# Patient Record
Sex: Female | Born: 1950 | Race: White | Hispanic: No | Marital: Married | Smoking: Former smoker
Health system: Southern US, Community
[De-identification: ages and names within clinical notes are randomized; demographics above are authoritative.]

## PROBLEM LIST (undated history)

## (undated) DIAGNOSIS — M1990 Unspecified osteoarthritis, unspecified site: Secondary | ICD-10-CM

## (undated) DIAGNOSIS — J45909 Unspecified asthma, uncomplicated: Secondary | ICD-10-CM

## (undated) DIAGNOSIS — F329 Major depressive disorder, single episode, unspecified: Secondary | ICD-10-CM

## (undated) DIAGNOSIS — E039 Hypothyroidism, unspecified: Secondary | ICD-10-CM

## (undated) DIAGNOSIS — J189 Pneumonia, unspecified organism: Secondary | ICD-10-CM

## (undated) DIAGNOSIS — T8859XA Other complications of anesthesia, initial encounter: Secondary | ICD-10-CM

## (undated) DIAGNOSIS — F32A Depression, unspecified: Secondary | ICD-10-CM

## (undated) HISTORY — PX: CERVICAL FUSION: SHX112

## (undated) HISTORY — PX: DILATION AND CURETTAGE OF UTERUS: SHX78

## (undated) HISTORY — PX: APPENDECTOMY: SHX54

## (undated) HISTORY — PX: DIAGNOSTIC LAPAROSCOPY: SUR761

## (undated) HISTORY — PX: BREAST SURGERY: SHX581

## (undated) HISTORY — PX: LUMBAR LAMINECTOMY/DECOMPRESSION MICRODISCECTOMY: SHX5026

## (undated) HISTORY — PX: TONSILLECTOMY: SUR1361

## (undated) HISTORY — PX: ABDOMINAL HYSTERECTOMY: SHX81

## (undated) HISTORY — PX: CHOLECYSTECTOMY: SHX55

---

## 2003-11-09 ENCOUNTER — Ambulatory Visit (HOSPITAL_COMMUNITY): Admission: RE | Admit: 2003-11-09 | Discharge: 2003-11-10 | Payer: Self-pay | Admitting: Neurosurgery

## 2008-11-09 ENCOUNTER — Encounter: Admission: RE | Admit: 2008-11-09 | Discharge: 2008-11-09 | Payer: Self-pay | Admitting: Neurosurgery

## 2010-06-27 ENCOUNTER — Encounter
Admission: RE | Admit: 2010-06-27 | Discharge: 2010-06-27 | Payer: Self-pay | Source: Home / Self Care | Attending: Neurosurgery | Admitting: Neurosurgery

## 2010-08-03 ENCOUNTER — Encounter (HOSPITAL_COMMUNITY)
Admission: RE | Admit: 2010-08-03 | Discharge: 2010-08-03 | Disposition: A | Discharge status: Home / Self Care | Payer: BC Managed Care – PPO | Source: Ambulatory Visit | Attending: Neurosurgery | Admitting: Neurosurgery

## 2010-08-03 LAB — CBC
HCT: 45.9 % (ref 36.0–46.0)
Hemoglobin: 15.2 g/dL — ABNORMAL HIGH (ref 12.0–15.0)
MCHC: 33.1 g/dL (ref 30.0–36.0)
MCV: 95 fL (ref 78.0–100.0)
RBC: 4.83 MIL/uL (ref 3.87–5.11)
RDW: 13.4 % (ref 11.5–15.5)
WBC: 7.6 10*3/uL (ref 4.0–10.5)

## 2010-08-08 ENCOUNTER — Other Ambulatory Visit (HOSPITAL_COMMUNITY): Payer: Self-pay | Admitting: Neurosurgery

## 2010-08-08 ENCOUNTER — Observation Stay (HOSPITAL_COMMUNITY)
Admission: RE | Admit: 2010-08-08 | Discharge: 2010-08-09 | Disposition: A | Discharge status: Home / Self Care | Payer: BC Managed Care – PPO | Source: Ambulatory Visit | Attending: Neurosurgery | Admitting: Neurosurgery

## 2010-08-08 ENCOUNTER — Inpatient Hospital Stay (HOSPITAL_COMMUNITY): Payer: BC Managed Care – PPO

## 2010-08-08 ENCOUNTER — Ambulatory Visit (HOSPITAL_COMMUNITY)
Admission: RE | Admit: 2010-08-08 | Discharge: 2010-08-08 | Disposition: A | Discharge status: Home / Self Care | Payer: BC Managed Care – PPO | Source: Ambulatory Visit | Attending: Neurosurgery | Admitting: Neurosurgery

## 2010-08-08 DIAGNOSIS — J449 Chronic obstructive pulmonary disease, unspecified: Secondary | ICD-10-CM | POA: Insufficient documentation

## 2010-08-08 DIAGNOSIS — J4489 Other specified chronic obstructive pulmonary disease: Secondary | ICD-10-CM | POA: Insufficient documentation

## 2010-08-08 DIAGNOSIS — Z01811 Encounter for preprocedural respiratory examination: Secondary | ICD-10-CM

## 2010-08-08 DIAGNOSIS — F17200 Nicotine dependence, unspecified, uncomplicated: Secondary | ICD-10-CM | POA: Insufficient documentation

## 2010-08-08 DIAGNOSIS — M6289 Other specified disorders of muscle: Secondary | ICD-10-CM | POA: Insufficient documentation

## 2010-08-08 DIAGNOSIS — Z23 Encounter for immunization: Secondary | ICD-10-CM | POA: Insufficient documentation

## 2010-08-08 DIAGNOSIS — M47812 Spondylosis without myelopathy or radiculopathy, cervical region: Principal | ICD-10-CM | POA: Insufficient documentation

## 2010-08-08 DIAGNOSIS — G5600 Carpal tunnel syndrome, unspecified upper limb: Secondary | ICD-10-CM | POA: Insufficient documentation

## 2010-08-08 DIAGNOSIS — Z01812 Encounter for preprocedural laboratory examination: Secondary | ICD-10-CM | POA: Insufficient documentation

## 2010-08-08 DIAGNOSIS — Z01810 Encounter for preprocedural cardiovascular examination: Secondary | ICD-10-CM | POA: Insufficient documentation

## 2010-08-14 NOTE — Op Note (Signed)
Donna French, KHURANA               ACCOUNT NO.:  [id number]  MEDICAL RECORD NO.:  [id number]           PATIENT TYPE:  LOCATION:                                 FACILITY:  PHYSICIAN:  Donalee Citrin, M.D.        DATE OF BIRTH:  1950/07/11  DATE OF PROCEDURE:  08/08/2010 DATE OF DISCHARGE:                              OPERATIVE REPORT   PREOPERATIVE DIAGNOSES:  Cervical spondylosis with stenosis C4-5, C5-6, and C6-7 with right greater than left C5, C6, and C7 radiculopathies and right carpal tunnel syndrome.  PROCEDURE:  Anterior cervical diskectomies and fusion at C4-5, C5-6, and C6-7 using the Globus PEEK cages packed with locally harvested autograft mixed with Actifuse and Atlantis translational plate with 8 fixed angle 30-mm screws.  SURGEON:  Donalee Citrin, MD  ASSISTANT:  Reinaldo Meeker, MD.  ANESTHESIA:  General endotracheal.  HISTORY OF PRESENT ILLNESS:  The patient is a very pleasant 60 year old female who has had progressive worsening neck and right greater than left arm pain that has been refractory all forms of conservative treatment.  MRI scan showed severe spondylosis with stenosis and spinal cord compression at C4-5, C5-6, and C6-7.  She also had EMG documented severe median nerve entrapment at the wrist consistent with carpal tunnel syndrome.  So the patient was recommended anterior cervical diskectomy and fusion at C4-5, C5-6, and C6-7 as well as right carpal tunnel release.  I went over the risk and benefits of the operation with her and she understood and agreed to proceed forward.  The patient was brought to the OR, induced under general anesthesia, and positioned supine with neck in slight extension and 5 pounds halter traction.  The right side of her neck was prepped and draped in the usual sterile fascia.  Preop x-ray localized the appropriate level, so a curvilinear incision was made just off midline through the anterior border of sternocleidomastoid.  The  superficial layer of platysma was dissected out and divided longitudinally.  The avascular plane between sternocleidomastoid and strap muscle was developed down to prevertebral fascia.  The prevertebral fascia was dissected with Kittners. Intraoperative x-ray identified the C4-5 disk space, so longus colli was then reflected laterally and self-retaining retractors were placed at all 3 disk space levels.  Then, large anterior osteophytes were bitten off with Leksell rongeur and 2 and 3-mm Kerrison punch.  All 3 disk spaces were drilled down capturing the bone shavings in mucus trap down the posterior annulus and osteophyte complexes.  First, working at C6-7, aggressive under biting at both endplates was carried out.  The PLL was identified and was removed in piecemeal fashion, decompressing central canal.  There was some large osteophyte coming off the C6 vertebral body.  This was all aggressively under bitten until central canal was widely decompressed marching across laterally to the level of the C7 pedicle and the C7 nerve root was skeletonized, flushed with pedicle unroofing both foramen.  Then, endplates were scraped and a size 8 PEEK cage packed with local autograft mixed with Actifuse was then inserted at C6-7.  Attention was taken to the C5-6.  In a similar fashion, C5-6 was drilled down.  Again, predominant uncinate hypertrophy was causing marked stenosis of the C6 foramina promptly in the right.  This was all unroofed and the central canal was decompressed with aggressive under biting of both endplates.  Both uncinate process were bitten decompressing the C6 nerve roots bilaterally.  Then, a 6-mm PEEK cage was packed with local autograft mixed with Actifuse was inserted at C5-6 after adequate preparation of the endplates and attention was taken to C4-5.  In a similar fashion, there was predominantly a large central spur at C4-5.  This was all aggressively under bitten.  Both C5  nerve roots were decompressed and the C6, graft was also inserted at C4-5. Then, a size 55-mm Atlantis transitional plate was placed.  All screws had excellent purchase.  Locking mechanisms were engaged.  The wound was then copiously irrigated.  Meticulous hemostasis was maintained and the wound was closed in layers with interrupted Vicryl in platysma and a running 4-0 subcuticular on the skin.  The patient was then repositioned and the right arm and hand were prepped and draped in the usual sterile fashion for right carpal tunnel release.  So, after 8 mL lidocaine with epi was inserted into an incision drawn up from the distal crease of the wrist along the palmar crease approximately 5 cm long.  Then, this was incised.  The transverse carpal ligament was identified and was incised sequentially in layers until the epineurium of the median nerve was identified.  Using hemostat, the plane between undersurface of the flexor retinaculum and the transverse carpal ligament was developed from the epineurium of median nerve and was then divided both proximally and distally.  Then after adequate decompression of the median nerve was achieved, the wound was copiously irrigated, meticulous hemostasis was maintained, and the skin was closed in layers with interrupted vertical mattress and the hand was dressed in Ace wrap, Kerlix, Adaptic, and bacitracin.  The patient went to the recovery room in stable condition. At the end of the case, needle, sponge, and instrument counts were correct.          ______________________________ Donalee Citrin, M.D.     GC/MEDQ  D:  08/08/2010  T:  08/09/2010  Job:  [id number]  Electronically Signed by Donalee Citrin M.D. on 08/14/2010 03:57:45 PM

## 2010-09-15 ENCOUNTER — Ambulatory Visit
Admission: RE | Admit: 2010-09-15 | Discharge: 2010-09-15 | Disposition: A | Discharge status: Home / Self Care | Payer: BC Managed Care – PPO | Source: Ambulatory Visit | Attending: Neurosurgery | Admitting: Neurosurgery

## 2010-09-15 ENCOUNTER — Other Ambulatory Visit: Payer: Self-pay | Admitting: Neurosurgery

## 2010-09-15 DIAGNOSIS — M25559 Pain in unspecified hip: Secondary | ICD-10-CM

## 2010-09-15 DIAGNOSIS — M542 Cervicalgia: Secondary | ICD-10-CM

## 2010-09-15 DIAGNOSIS — M79609 Pain in unspecified limb: Secondary | ICD-10-CM

## 2010-10-13 ENCOUNTER — Other Ambulatory Visit: Payer: Self-pay | Admitting: Neurosurgery

## 2010-10-13 ENCOUNTER — Ambulatory Visit
Admission: RE | Admit: 2010-10-13 | Discharge: 2010-10-13 | Disposition: A | Discharge status: Home / Self Care | Payer: BC Managed Care – PPO | Source: Ambulatory Visit | Attending: Neurosurgery | Admitting: Neurosurgery

## 2010-10-13 DIAGNOSIS — M542 Cervicalgia: Secondary | ICD-10-CM

## 2010-10-13 MED ORDER — GADOBENATE DIMEGLUMINE 529 MG/ML IV SOLN
10.0000 mL | Freq: Once | INTRAVENOUS | Status: AC | PRN
  Administered 2010-10-13: 10 mL via INTRAVENOUS

## 2010-10-14 NOTE — Op Note (Signed)
Donna French, Donna French                           ACCOUNT NO.:  [id number]   MEDICAL RECORD NO.:  [id number]                   PATIENT TYPE:  OIB   LOCATION:  2899                                 FACILITY:  [facility]   PHYSICIAN:  Donalee Citrin, M.D.                     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   PROCEDURE:  Laminectomy and microdiscectomy at L5-S1 with microscopic  dissection of the left S1 nerve root.   SURGEON:  Donalee Citrin, M.D.   ASSISTANT:  Kathaleen Maser. Pool, M.D.   ANESTHESIA:  General endotracheal anesthesia.   HISTORY OF PRESENT ILLNESS:  The patient is a very pleasant 60 year old  female who has long-standing chronic low back pain that has gotten  progressively worse over the last several months with radiation down the  right leg to the bottom of her foot consistent with S1 radiculopathy.  Preoperative imaging showed a ruptured disc at L5-S1 compressing the S1  nerve root.  The patient failed all forms of conservative treatment with  epidural steroid injections.  The patient was recommended laminectomy and  microdiscectomy.  I discussed the risks and benefits of the surgery with  her, she understood and agreed to proceed forth.   DESCRIPTION OF PROCEDURE:  The patient was brought to the operating room,  given general anesthesia, positioned prone on the Wilson frame.  The back  was prepped and draped in the usual sterile fashion.  After a preop x-ray  localized the L5 pedicle and infiltrated with 10 mL of lidocaine with  epinephrine, a midline incision was made.  Bovie electrocautery was used to  dissect  through the subcutaneous tissue and subperiosteal dissection was  carried out at the lamina of L5 and S1 on the left side.  Interoperative x-  ray confirmed localization of the L5-S1 disc space.  Then, a high speed  drill was used to drill down the inferior aspect of the lamina of L5 and  medial facet complex and the spinous process of L5.  Then, using a 2 mm  Kerrison punch, the inferior aspect of the lamina of L5 was removed and the  medial aspect of the facet complexes were removed exposing the ligamentum  flavum which was removed in a piecemeal fashion.  The thecal sac was then  visualized.  The operating microscope was draped and under microscopic  visualization, the S1 nerve root was identified and the lateral gutter was  under bitten decompressing the lateral spinal canal and under biting the  ligamentum flavum.  The S1 nerve root was decompressed out its neural  foramen then using a #4 Penfield, the S1 nerve root was dissected off a  large, bulbous disc fragment still contained with the ligament and a free  fragment caudally.  The annulotomy was done with an 11 blade scalpel and the  free fragment was incised.  The disc space was entered and cleaned out with  pituitary rongeurs.  Several fragments were removed from the central  compartment with a combination of downgoing Epstein curets, upbiting  pituitaries, and straight pituitaries.  The lateral aspect of the incision  was also cleaned out with a combination of straight pituitaries and  downgoing curets.  At the end of the discectomy, there was no further  stenosis on either the thecal sac or the S1 nerve root on exploration with a  hockey stick, coronary dilator, and a #4 Penfield.  The wound was copiously  irrigated and meticulous hemostasis was maintained.  Gelfoam was overlaid on  top of the dura.  The muscle and fascia were reapproximated with 0  interrupted Vicryl, the subcutaneous tissue was closed with 2-0 interrupted   Vicryl, and skin was closed with running 4-0 subcuticular.  Benzoin and  Steri-Strips were applied.  The patient went to the recovery room in stable  condition.  At the end of the case, the needle, sponge, and instrument  counts were correct.                                               Donalee Citrin, M.D.    GC/MEDQ  D:  11/09/2003  T:  11/09/2003  Job:  [id number]

## 2010-10-20 ENCOUNTER — Inpatient Hospital Stay (HOSPITAL_COMMUNITY): Payer: BC Managed Care – PPO

## 2010-10-20 ENCOUNTER — Ambulatory Visit (HOSPITAL_COMMUNITY)
Admission: RE | Admit: 2010-10-20 | Discharge: 2010-10-21 | Disposition: A | Discharge status: Home / Self Care | Payer: BC Managed Care – PPO | Source: Ambulatory Visit | Attending: Neurosurgery | Admitting: Neurosurgery

## 2010-10-20 DIAGNOSIS — M47812 Spondylosis without myelopathy or radiculopathy, cervical region: Principal | ICD-10-CM | POA: Insufficient documentation

## 2010-10-20 LAB — SURGICAL PCR SCREEN
MRSA, PCR: NEGATIVE
Staphylococcus aureus: NEGATIVE

## 2010-10-20 LAB — CBC
HCT: 41.8 % (ref 36.0–46.0)
MCH: 31.8 pg (ref 26.0–34.0)
MCHC: 34 g/dL (ref 30.0–36.0)
MCV: 93.5 fL (ref 78.0–100.0)

## 2010-10-28 NOTE — Op Note (Signed)
NAMESAMANTH, MIRKIN               ACCOUNT NO.:  [id number]  MEDICAL RECORD NO.:  [id number]           PATIENT TYPE:  I  LOCATION:  3535                         FACILITY:  [facility]  PHYSICIAN:  Donalee Citrin, M.D.        DATE OF BIRTH:  [birth year]  DATE OF PROCEDURE:  10/20/2010 DATE OF DISCHARGE:                              OPERATIVE REPORT   PREOPERATIVE DIAGNOSIS:  Cervical spondylosis with stenosis and instability at C3-4.  PROCEDURES:  Anterior cervical diskectomy and fusion at C3-4 using Orthofix PEEK cage packed with local autograft mixed with Actifuse and an Atlantis translational plate, re-exploration of fusion, removal of hardware C4-C7, replacement of plate from Z6-X0.  SURGEON:  Donalee Citrin, MD  ASSISTANT:  Tia Alert, MD  ANESTHESIA:  General endotracheal.  HISTORY OF PRESENT ILLNESS:  The patient is a very pleasant 60 year old female who underwent C4-C7 fusion back several months ago.  Initially, did very well.  However, over the last several weeks to months, has had progressive worsening, numbness and tingling in her arms whenever she extends her neck back.  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  The patient was recommended intracervical diskectomy and fusion at C3-4 with exploration of fusion, removal of hardware, and extension of her plate down to C7. Risks and benefits of the operation were explained to the patient.  She understood and agreed to proceed forward.  The patient was brought to the OR, was induced under general anesthesia, positioned supine, the neck flexed in extension with 5 pounds of Halter traction.  The right side of her neck was prepped and draped in usual sterile fashion.  Her old incision was prepped out and x-ray used that to localize and it is felt to be  appropriate location to enter and also be allowed acces to C3-4 disk space.  So, the old incision was opened up and the subcutaneous tissue was dissected free off the platysma and the platysma was dissected longitudinally.  The avascular plane between the sternocleidomastoid and strap muscle was developed down to the prevertebral fascia.  Prevertebral fascia was dissected with Kitners. There was extensive amount of scar tissue, but the avascular plane was eventually discovered and the old plate was identified and was dissected free.  The locking mechanisms were disengaged.  The screws were removed and plate was removed.  Then, the C2-4 disk space was dissected out. Longus colli was reflected laterally and self-retaining retractor was placed.  Disk space was incised.  It was noted to be markedly hypermobile as well as anterior disk herniation and instability at this level.  All anterior osteophytes were bitten off with 2 and 3 Kerrison punch.  Disk space drilled down and the bone fragments captured in a mucus trap down the posterior annulus and osteophyte complex.  At this point, the operative microscope was draped,  brought into the field and under microscopic illumination, aggressive underbiting of both endplates was carried out.  The  PLL was identified and removed in  a piecemeal fashion exposing the thecal sac marched across laterally to the level of both C4 pedicles.  Both C4 nerve roots were decompressed.  Then, the cage was applied at C3-4.  After adequate decompression both centrally and foraminally had been achieved, the cage was packed with local autograft mixed with Actifuse and then an Atlantis translational plate was sized up.  Screws were placed in 7 and new screws in 3, also holes were placed in 4.  However, the screws in C5-C6 were not able to be replaced secondary to the plate holder not lining up.  However, she had at least 70% fusion by x-ray at these levels anyway, so it  was elected to leave the screws out as opposed to drilling new holes that might compromise integrity of vertebral body.  The wound was then copiously irrigated.  Meticulous hemostasis was maintained.  A drain was placed. The wound was closed in layers with interrupted Vicryl and the skin was closed with running 4-0 subcuticular.  Benzoin and Steri-Strips applied. The patient went to recovery room in stable condition.  At the end of the case, sponge and instruments count were correct.          ______________________________ Donalee Citrin, M.D.     GC/MEDQ  D:  10/20/2010  T:  10/21/2010  Job:  [id number]  Electronically Signed by Donalee Citrin M.D. on 10/28/2010 07:06:15 AM

## 2010-12-08 ENCOUNTER — Ambulatory Visit
Admission: RE | Admit: 2010-12-08 | Discharge: 2010-12-08 | Disposition: A | Discharge status: Home / Self Care | Payer: BC Managed Care – PPO | Source: Ambulatory Visit | Attending: Neurosurgery | Admitting: Neurosurgery

## 2010-12-08 ENCOUNTER — Other Ambulatory Visit: Payer: Self-pay | Admitting: Neurosurgery

## 2010-12-08 DIAGNOSIS — M542 Cervicalgia: Secondary | ICD-10-CM

## 2011-01-19 ENCOUNTER — Other Ambulatory Visit: Payer: Self-pay | Admitting: Neurosurgery

## 2011-01-19 ENCOUNTER — Ambulatory Visit
Admission: RE | Admit: 2011-01-19 | Discharge: 2011-01-19 | Disposition: A | Discharge status: Home / Self Care | Payer: BC Managed Care – PPO | Source: Ambulatory Visit | Attending: Neurosurgery | Admitting: Neurosurgery

## 2011-01-19 DIAGNOSIS — M542 Cervicalgia: Secondary | ICD-10-CM

## 2011-03-21 ENCOUNTER — Ambulatory Visit
Admission: RE | Admit: 2011-03-21 | Discharge: 2011-03-21 | Disposition: A | Discharge status: Home / Self Care | Payer: BC Managed Care – PPO | Source: Ambulatory Visit | Attending: Neurosurgery | Admitting: Neurosurgery

## 2011-03-21 ENCOUNTER — Other Ambulatory Visit: Payer: Self-pay | Admitting: Neurosurgery

## 2011-03-21 DIAGNOSIS — M47812 Spondylosis without myelopathy or radiculopathy, cervical region: Secondary | ICD-10-CM

## 2011-03-21 DIAGNOSIS — M79609 Pain in unspecified limb: Secondary | ICD-10-CM

## 2011-03-21 DIAGNOSIS — M25559 Pain in unspecified hip: Secondary | ICD-10-CM

## 2011-06-08 ENCOUNTER — Other Ambulatory Visit: Payer: Self-pay | Admitting: Neurosurgery

## 2011-06-08 ENCOUNTER — Ambulatory Visit
Admission: RE | Admit: 2011-06-08 | Discharge: 2011-06-08 | Disposition: A | Discharge status: Home / Self Care | Payer: BC Managed Care – PPO | Source: Ambulatory Visit | Attending: Neurosurgery | Admitting: Neurosurgery

## 2011-06-08 DIAGNOSIS — M542 Cervicalgia: Secondary | ICD-10-CM

## 2011-06-08 DIAGNOSIS — M545 Low back pain: Secondary | ICD-10-CM

## 2011-06-08 DIAGNOSIS — M549 Dorsalgia, unspecified: Secondary | ICD-10-CM

## 2011-11-02 ENCOUNTER — Other Ambulatory Visit: Payer: Self-pay | Admitting: Neurosurgery

## 2011-11-28 ENCOUNTER — Encounter (HOSPITAL_COMMUNITY)
Admission: RE | Admit: 2011-11-28 | Discharge: 2011-11-28 | Disposition: A | Discharge status: Home / Self Care | Payer: BC Managed Care – PPO | Source: Ambulatory Visit | Attending: Neurosurgery | Admitting: Neurosurgery

## 2011-11-28 ENCOUNTER — Encounter (HOSPITAL_COMMUNITY): Payer: Self-pay

## 2011-11-28 HISTORY — DX: Depression, unspecified: F32.A

## 2011-11-28 HISTORY — DX: Hypothyroidism, unspecified: E03.9

## 2011-11-28 HISTORY — DX: Major depressive disorder, single episode, unspecified: F32.9

## 2011-11-28 LAB — CBC
MCV: 94.2 fL (ref 78.0–100.0)
Platelets: 215 10*3/uL (ref 150–400)
RBC: 4.45 MIL/uL (ref 3.87–5.11)
WBC: 6.8 10*3/uL (ref 4.0–10.5)

## 2011-11-28 NOTE — Pre-Procedure Instructions (Signed)
20 Donna French  11/28/2011   Your procedure is scheduled on:  December 06, 2011 @ 11:00  Report to [facility] at 9:00 AM.  Call this number if you have problems the morning of surgery: [phone]   Remember:   Do not eat food:After Midnight.     Take these medicines the morning of surgery with A SIP OF WATER: synthroid, zoloft   Do not wear jewelry, make-up or nail polish.  Do not wear lotions, powders, or perfumes. You may wear deodorant.  Do not shave 48 hours prior to surgery. Men may shave face and neck.  Do not bring valuables to the hospital.  Contacts, dentures or bridgework may not be worn into surgery.  Leave suitcase in the car. After surgery it may be brought to your room.  For patients admitted to the hospital, checkout time is 11:00 AM the day of discharge.   Patients discharged the day of surgery will not be allowed to drive home.  Name and phone number of your driver:   Special Instructions: CHG Shower Use Special Wash: 1/2 bottle night before surgery and 1/2 bottle morning of surgery.   Please read over the following fact sheets that you were given: Pain Booklet, Coughing and Deep Breathing and Surgical Site Infection Prevention

## 2011-12-05 MED ORDER — CEFAZOLIN SODIUM-DEXTROSE 2-3 GM-% IV SOLR
2.0000 g | INTRAVENOUS | Status: AC
  Administered 2011-12-06 (×2): 2 g via INTRAVENOUS
  Filled 2011-12-05: qty 50

## 2011-12-05 MED ORDER — DEXAMETHASONE SODIUM PHOSPHATE 10 MG/ML IJ SOLN
10.0000 mg | INTRAMUSCULAR | Status: AC
  Administered 2011-12-06: 10 mg via INTRAVENOUS
  Filled 2011-12-05: qty 1

## 2011-12-06 ENCOUNTER — Ambulatory Visit (HOSPITAL_COMMUNITY): Payer: BC Managed Care – PPO | Admitting: Anesthesiology

## 2011-12-06 ENCOUNTER — Ambulatory Visit (HOSPITAL_COMMUNITY): Payer: BC Managed Care – PPO

## 2011-12-06 ENCOUNTER — Encounter (HOSPITAL_COMMUNITY): Payer: Self-pay | Admitting: Anesthesiology

## 2011-12-06 ENCOUNTER — Encounter (HOSPITAL_COMMUNITY)
Admission: RE | Disposition: A | Discharge status: Home / Self Care | Payer: Self-pay | Source: Ambulatory Visit | Attending: Neurosurgery

## 2011-12-06 ENCOUNTER — Encounter (HOSPITAL_COMMUNITY): Payer: Self-pay | Admitting: *Deleted

## 2011-12-06 ENCOUNTER — Inpatient Hospital Stay (HOSPITAL_COMMUNITY)
Admission: RE | Admit: 2011-12-06 | Discharge: 2011-12-13 | DRG: 755 | Disposition: A | Discharge status: Home / Self Care | Payer: BC Managed Care – PPO | Source: Ambulatory Visit | Attending: Neurosurgery | Admitting: Neurosurgery

## 2011-12-06 DIAGNOSIS — Z01818 Encounter for other preprocedural examination: Secondary | ICD-10-CM

## 2011-12-06 DIAGNOSIS — M4120 Other idiopathic scoliosis, site unspecified: Secondary | ICD-10-CM | POA: Diagnosis present

## 2011-12-06 DIAGNOSIS — F17200 Nicotine dependence, unspecified, uncomplicated: Secondary | ICD-10-CM | POA: Diagnosis present

## 2011-12-06 DIAGNOSIS — K929 Disease of digestive system, unspecified: Secondary | ICD-10-CM | POA: Diagnosis not present

## 2011-12-06 DIAGNOSIS — M5137 Other intervertebral disc degeneration, lumbosacral region: Principal | ICD-10-CM | POA: Diagnosis present

## 2011-12-06 DIAGNOSIS — K560 Paralytic ileus: Secondary | ICD-10-CM | POA: Diagnosis not present

## 2011-12-06 DIAGNOSIS — R609 Edema, unspecified: Secondary | ICD-10-CM | POA: Diagnosis not present

## 2011-12-06 DIAGNOSIS — F3289 Other specified depressive episodes: Secondary | ICD-10-CM | POA: Diagnosis present

## 2011-12-06 DIAGNOSIS — M51379 Other intervertebral disc degeneration, lumbosacral region without mention of lumbar back pain or lower extremity pain: Principal | ICD-10-CM | POA: Diagnosis present

## 2011-12-06 DIAGNOSIS — E039 Hypothyroidism, unspecified: Secondary | ICD-10-CM | POA: Diagnosis present

## 2011-12-06 DIAGNOSIS — Y831 Surgical operation with implant of artificial internal device as the cause of abnormal reaction of the patient, or of later complication, without mention of misadventure at the time of the procedure: Secondary | ICD-10-CM | POA: Diagnosis not present

## 2011-12-06 DIAGNOSIS — Z01812 Encounter for preprocedural laboratory examination: Secondary | ICD-10-CM

## 2011-12-06 DIAGNOSIS — F329 Major depressive disorder, single episode, unspecified: Secondary | ICD-10-CM | POA: Diagnosis present

## 2011-12-06 DIAGNOSIS — IMO0002 Reserved for concepts with insufficient information to code with codable children: Secondary | ICD-10-CM | POA: Diagnosis present

## 2011-12-06 LAB — CBC WITH DIFFERENTIAL/PLATELET
Eosinophils Absolute: 0 10*3/uL (ref 0.0–0.7)
HCT: 31.3 % — ABNORMAL LOW (ref 36.0–46.0)
Hemoglobin: 10.7 g/dL — ABNORMAL LOW (ref 12.0–15.0)
Lymphs Abs: 0.8 10*3/uL (ref 0.7–4.0)
MCH: 31.7 pg (ref 26.0–34.0)
Monocytes Absolute: 1.1 10*3/uL — ABNORMAL HIGH (ref 0.1–1.0)
Monocytes Relative: 7 % (ref 3–12)
Neutrophils Relative %: 89 % — ABNORMAL HIGH (ref 43–77)
RBC: 3.38 MIL/uL — ABNORMAL LOW (ref 3.87–5.11)

## 2011-12-06 SURGERY — POSTERIOR LUMBAR FUSION 4 LEVEL
Anesthesia: General | Site: Spine Lumbar | Wound class: Clean

## 2011-12-06 MED ORDER — CEFAZOLIN SODIUM 1-5 GM-% IV SOLN
1.0000 g | Freq: Three times a day (TID) | INTRAVENOUS | Status: AC
  Administered 2011-12-07 – 2011-12-11 (×15): 1 g via INTRAVENOUS
  Filled 2011-12-06 (×16): qty 50

## 2011-12-06 MED ORDER — POTASSIUM CHLORIDE IN NACL 20-0.9 MEQ/L-% IV SOLN
INTRAVENOUS | Status: DC
  Administered 2011-12-07: 1000 mL via INTRAVENOUS
  Administered 2011-12-07 – 2011-12-11 (×8): via INTRAVENOUS
  Filled 2011-12-06 (×12): qty 1000

## 2011-12-06 MED ORDER — CYCLOBENZAPRINE HCL 10 MG PO TABS
10.0000 mg | ORAL_TABLET | Freq: Three times a day (TID) | ORAL | Status: DC | PRN
  Administered 2011-12-06 – 2011-12-11 (×2): 10 mg via ORAL
  Filled 2011-12-06 (×4): qty 1

## 2011-12-06 MED ORDER — VECURONIUM BROMIDE 10 MG IV SOLR
INTRAVENOUS | Status: DC | PRN
  Administered 2011-12-06: 2 mg via INTRAVENOUS
  Administered 2011-12-06 (×3): 1 mg via INTRAVENOUS
  Administered 2011-12-06 (×3): 2 mg via INTRAVENOUS
  Administered 2011-12-06 (×2): 1 mg via INTRAVENOUS
  Administered 2011-12-06: 2 mg via INTRAVENOUS
  Administered 2011-12-06 (×2): 1 mg via INTRAVENOUS
  Administered 2011-12-06: 2 mg via INTRAVENOUS

## 2011-12-06 MED ORDER — BACITRACIN 50000 UNITS IM SOLR
INTRAMUSCULAR | Status: AC
  Filled 2011-12-06: qty 1

## 2011-12-06 MED ORDER — MENTHOL 3 MG MT LOZG: 1.0000 | LOZENGE | OROMUCOSAL | Status: DC | PRN

## 2011-12-06 MED ORDER — SODIUM CHLORIDE 0.9 % IV SOLN
INTRAVENOUS | Status: AC
  Filled 2011-12-06: qty 500

## 2011-12-06 MED ORDER — SODIUM CHLORIDE 0.9 % IJ SOLN
3.0000 mL | Freq: Two times a day (BID) | INTRAMUSCULAR | Status: DC
  Administered 2011-12-07 – 2011-12-13 (×9): 3 mL via INTRAVENOUS

## 2011-12-06 MED ORDER — ACETAMINOPHEN 325 MG PO TABS
650.0000 mg | ORAL_TABLET | ORAL | Status: DC | PRN
  Administered 2011-12-10: 650 mg via ORAL
  Filled 2011-12-06 (×2): qty 2

## 2011-12-06 MED ORDER — HETASTARCH-ELECTROLYTES 6 % IV SOLN
INTRAVENOUS | Status: DC | PRN
  Administered 2011-12-06: 14:00:00 via INTRAVENOUS

## 2011-12-06 MED ORDER — BUPIVACAINE HCL (PF) 0.25 % IJ SOLN
INTRAMUSCULAR | Status: DC | PRN
  Administered 2011-12-06: 20 mL

## 2011-12-06 MED ORDER — 0.9 % SODIUM CHLORIDE (POUR BTL) OPTIME
TOPICAL | Status: DC | PRN
  Administered 2011-12-06: 1000 mL

## 2011-12-06 MED ORDER — HYDROCODONE-ACETAMINOPHEN 5-325 MG PO TABS
2.0000 | ORAL_TABLET | ORAL | Status: DC | PRN
  Administered 2011-12-07 – 2011-12-13 (×6): 2 via ORAL
  Filled 2011-12-06 (×2): qty 2
  Filled 2011-12-06: qty 1
  Filled 2011-12-06 (×2): qty 2
  Filled 2011-12-06: qty 1
  Filled 2011-12-06 (×2): qty 2

## 2011-12-06 MED ORDER — SODIUM CHLORIDE 0.9 % IV SOLN: 250.0000 mL | INTRAVENOUS | Status: DC

## 2011-12-06 MED ORDER — LIDOCAINE-EPINEPHRINE 1 %-1:100000 IJ SOLN
INTRAMUSCULAR | Status: DC | PRN
  Administered 2011-12-06: 10 mL

## 2011-12-06 MED ORDER — HYDROMORPHONE HCL PF 1 MG/ML IJ SOLN
0.5000 mg | INTRAMUSCULAR | Status: DC | PRN
  Administered 2011-12-07 (×3): 1 mg via INTRAVENOUS
  Administered 2011-12-08: 0.5 mg via INTRAVENOUS
  Administered 2011-12-08 – 2011-12-11 (×13): 1 mg via INTRAVENOUS
  Filled 2011-12-06 (×17): qty 1

## 2011-12-06 MED ORDER — PHENYLEPHRINE HCL 10 MG/ML IJ SOLN
INTRAMUSCULAR | Status: DC | PRN
  Administered 2011-12-06 (×3): 80 ug via INTRAVENOUS
  Administered 2011-12-06: 40 ug via INTRAVENOUS
  Administered 2011-12-06: 80 ug via INTRAVENOUS

## 2011-12-06 MED ORDER — SODIUM CHLORIDE 0.9 % IJ SOLN: 3.0000 mL | INTRAMUSCULAR | Status: DC | PRN

## 2011-12-06 MED ORDER — SODIUM CHLORIDE 0.9 % IR SOLN
Status: DC | PRN
  Administered 2011-12-06: 13:00:00

## 2011-12-06 MED ORDER — THROMBIN 20000 UNITS EX SOLR
CUTANEOUS | Status: DC | PRN
  Administered 2011-12-06 (×3): 20000 [IU] via TOPICAL

## 2011-12-06 MED ORDER — SERTRALINE HCL 100 MG PO TABS
100.0000 mg | ORAL_TABLET | Freq: Every day | ORAL | Status: DC
  Administered 2011-12-07 – 2011-12-13 (×7): 100 mg via ORAL
  Filled 2011-12-06 (×7): qty 1

## 2011-12-06 MED ORDER — ONDANSETRON HCL 4 MG/2ML IJ SOLN: 4.0000 mg | Freq: Four times a day (QID) | INTRAMUSCULAR | Status: DC | PRN

## 2011-12-06 MED ORDER — CEFAZOLIN SODIUM-DEXTROSE 2-3 GM-% IV SOLR
INTRAVENOUS | Status: AC
  Filled 2011-12-06: qty 50

## 2011-12-06 MED ORDER — CYCLOBENZAPRINE HCL 10 MG PO TABS
ORAL_TABLET | ORAL | Status: AC
  Filled 2011-12-06: qty 1

## 2011-12-06 MED ORDER — LIDOCAINE HCL (CARDIAC) 20 MG/ML IV SOLN
INTRAVENOUS | Status: DC | PRN
  Administered 2011-12-06: 60 mg via INTRAVENOUS

## 2011-12-06 MED ORDER — OXYCODONE-ACETAMINOPHEN 5-325 MG PO TABS
1.0000 | ORAL_TABLET | ORAL | Status: DC | PRN
  Administered 2011-12-07: 2 via ORAL
  Filled 2011-12-06: qty 2

## 2011-12-06 MED ORDER — FENTANYL CITRATE 0.05 MG/ML IJ SOLN
INTRAMUSCULAR | Status: DC | PRN
  Administered 2011-12-06: 100 ug via INTRAVENOUS
  Administered 2011-12-06 (×4): 50 ug via INTRAVENOUS
  Administered 2011-12-06: 100 ug via INTRAVENOUS
  Administered 2011-12-06 (×5): 50 ug via INTRAVENOUS
  Administered 2011-12-06 (×2): 100 ug via INTRAVENOUS

## 2011-12-06 MED ORDER — ACETAMINOPHEN 650 MG RE SUPP: 650.0000 mg | RECTAL | Status: DC | PRN

## 2011-12-06 MED ORDER — LEVOTHYROXINE SODIUM 88 MCG PO TABS
88.0000 ug | ORAL_TABLET | Freq: Every day | ORAL | Status: DC
  Administered 2011-12-07 – 2011-12-13 (×7): 88 ug via ORAL
  Filled 2011-12-06 (×8): qty 1

## 2011-12-06 MED ORDER — ONDANSETRON HCL 4 MG/2ML IJ SOLN
4.0000 mg | INTRAMUSCULAR | Status: DC | PRN
  Administered 2011-12-07 – 2011-12-08 (×3): 4 mg via INTRAVENOUS
  Filled 2011-12-06 (×5): qty 2

## 2011-12-06 MED ORDER — THROMBIN 20000 UNITS EX KIT
PACK | CUTANEOUS | Status: DC | PRN
  Administered 2011-12-06 (×3): via TOPICAL

## 2011-12-06 MED ORDER — HEPARIN SODIUM (PORCINE) 1000 UNIT/ML IJ SOLN
INTRAMUSCULAR | Status: AC
  Filled 2011-12-06: qty 1

## 2011-12-06 MED ORDER — MIDAZOLAM HCL 5 MG/5ML IJ SOLN
INTRAMUSCULAR | Status: DC | PRN
  Administered 2011-12-06: 2 mg via INTRAVENOUS

## 2011-12-06 MED ORDER — ONDANSETRON HCL 4 MG/2ML IJ SOLN
INTRAMUSCULAR | Status: DC | PRN
  Administered 2011-12-06: 4 mg via INTRAVENOUS

## 2011-12-06 MED ORDER — PHENOL 1.4 % MT LIQD: 1.0000 | OROMUCOSAL | Status: DC | PRN

## 2011-12-06 MED ORDER — HYDROMORPHONE HCL PF 1 MG/ML IJ SOLN
INTRAMUSCULAR | Status: AC
  Filled 2011-12-06: qty 1

## 2011-12-06 MED ORDER — HYDROMORPHONE HCL PF 1 MG/ML IJ SOLN
INTRAMUSCULAR | Status: AC
  Administered 2011-12-06: 0.5 mg via INTRAVENOUS
  Filled 2011-12-06: qty 1

## 2011-12-06 MED ORDER — EPHEDRINE SULFATE 50 MG/ML IJ SOLN
INTRAMUSCULAR | Status: DC | PRN
  Administered 2011-12-06 (×4): 5 mg via INTRAVENOUS
  Administered 2011-12-06: 10 mg via INTRAVENOUS
  Administered 2011-12-06 (×4): 5 mg via INTRAVENOUS

## 2011-12-06 MED ORDER — ROCURONIUM BROMIDE 100 MG/10ML IV SOLN
INTRAVENOUS | Status: DC | PRN
  Administered 2011-12-06: 50 mg via INTRAVENOUS

## 2011-12-06 MED ORDER — LACTATED RINGERS IV SOLN
INTRAVENOUS | Status: DC | PRN
  Administered 2011-12-06 (×5): via INTRAVENOUS

## 2011-12-06 MED ORDER — GLYCOPYRROLATE 0.2 MG/ML IJ SOLN
INTRAMUSCULAR | Status: DC | PRN
  Administered 2011-12-06 (×2): 0.1 mg via INTRAVENOUS
  Administered 2011-12-06: 0.6 mg via INTRAVENOUS
  Administered 2011-12-06: 0.2 mg via INTRAVENOUS
  Administered 2011-12-06: 0.1 mg via INTRAVENOUS

## 2011-12-06 MED ORDER — HYDROMORPHONE HCL PF 1 MG/ML IJ SOLN
0.2500 mg | INTRAMUSCULAR | Status: DC | PRN
  Administered 2011-12-06 (×3): 0.5 mg via INTRAVENOUS

## 2011-12-06 MED ORDER — PROPOFOL 10 MG/ML IV EMUL
INTRAVENOUS | Status: DC | PRN
  Administered 2011-12-06: 30 mg via INTRAVENOUS
  Administered 2011-12-06: 170 mg via INTRAVENOUS

## 2011-12-06 MED ORDER — NEOSTIGMINE METHYLSULFATE 1 MG/ML IJ SOLN
INTRAMUSCULAR | Status: DC | PRN
  Administered 2011-12-06: 3 mg via INTRAVENOUS

## 2011-12-06 SURGICAL SUPPLY — 87 items
7mmx10x22 Caliber ×4 IMPLANT
BAG DECANTER FOR FLEXI CONT (MISCELLANEOUS) ×2 IMPLANT
BENZOIN TINCTURE PRP APPL 2/3 (GAUZE/BANDAGES/DRESSINGS) ×2 IMPLANT
BLADE SURG 11 STRL SS (BLADE) ×2 IMPLANT
BLADE SURG ROTATE 9660 (MISCELLANEOUS) IMPLANT
BRUSH SCRUB EZ PLAIN DRY (MISCELLANEOUS) ×2 IMPLANT
BUR MATCHSTICK NEURO 3.0 LAGG (BURR) ×2 IMPLANT
BUR PRECISION FLUTE 6.0 (BURR) ×2 IMPLANT
CANISTER SUCTION 2500CC (MISCELLANEOUS) ×2 IMPLANT
CAP LOCKING REVERE (Cap) ×32 IMPLANT
CLOTH BEACON ORANGE TIMEOUT ST (SAFETY) ×2 IMPLANT
CONT SPEC 4OZ CLIKSEAL STRL BL (MISCELLANEOUS) ×4 IMPLANT
COVER BACK TABLE 24X17X13 BIG (DRAPES) ×2 IMPLANT
COVER TABLE BACK 60X90 (DRAPES) IMPLANT
CROSSLINK 40-60 ×2 IMPLANT
DECANTER SPIKE VIAL GLASS SM (MISCELLANEOUS) ×2 IMPLANT
DERMABOND ADVANCED (GAUZE/BANDAGES/DRESSINGS) ×2
DERMABOND ADVANCED .7 DNX12 (GAUZE/BANDAGES/DRESSINGS) ×2 IMPLANT
DRAPE C-ARM 42X72 X-RAY (DRAPES) ×4 IMPLANT
DRAPE C-ARMOR (DRAPES) ×2 IMPLANT
DRAPE LAPAROTOMY 100X72X124 (DRAPES) ×2 IMPLANT
DRAPE POUCH INSTRU U-SHP 10X18 (DRAPES) ×2 IMPLANT
DRAPE PROXIMA HALF (DRAPES) IMPLANT
DRAPE SURG 17X23 STRL (DRAPES) ×2 IMPLANT
DRSG OPSITE 4X5.5 SM (GAUZE/BANDAGES/DRESSINGS) ×2 IMPLANT
ELECT REM PT RETURN 9FT ADLT (ELECTROSURGICAL) ×2
ELECTRODE REM PT RTRN 9FT ADLT (ELECTROSURGICAL) ×1 IMPLANT
EVACUATOR 3/16  PVC DRAIN (DRAIN) ×1
EVACUATOR 3/16 PVC DRAIN (DRAIN) ×1 IMPLANT
GAUZE SPONGE 4X4 16PLY XRAY LF (GAUZE/BANDAGES/DRESSINGS) ×4 IMPLANT
GLOVE BIO SURGEON STRL SZ8 (GLOVE) ×6 IMPLANT
GLOVE BIOGEL M 8.0 STRL (GLOVE) ×2 IMPLANT
GLOVE BIOGEL PI IND STRL 7.0 (GLOVE) ×1 IMPLANT
GLOVE BIOGEL PI IND STRL 8 (GLOVE) ×1 IMPLANT
GLOVE BIOGEL PI INDICATOR 7.0 (GLOVE) ×1
GLOVE BIOGEL PI INDICATOR 8 (GLOVE) ×1
GLOVE ECLIPSE 7.5 STRL STRAW (GLOVE) ×6 IMPLANT
GLOVE EXAM NITRILE LRG STRL (GLOVE) IMPLANT
GLOVE EXAM NITRILE MD LF STRL (GLOVE) ×4 IMPLANT
GLOVE EXAM NITRILE XL STR (GLOVE) IMPLANT
GLOVE EXAM NITRILE XS STR PU (GLOVE) IMPLANT
GLOVE INDICATOR 7.0 STRL GRN (GLOVE) ×6 IMPLANT
GLOVE INDICATOR 7.5 STRL GRN (GLOVE) ×2 IMPLANT
GLOVE INDICATOR 8.5 STRL (GLOVE) ×4 IMPLANT
GLOVE SS BIOGEL STRL SZ 6.5 (GLOVE) ×4 IMPLANT
GLOVE SS BIOGEL STRL SZ 7 (GLOVE) ×4 IMPLANT
GLOVE SUPERSENSE BIOGEL SZ 6.5 (GLOVE) ×4
GLOVE SUPERSENSE BIOGEL SZ 7 (GLOVE) ×4
GOWN BRE IMP SLV AUR LG STRL (GOWN DISPOSABLE) ×8 IMPLANT
GOWN BRE IMP SLV AUR XL STRL (GOWN DISPOSABLE) ×6 IMPLANT
GOWN STRL REIN 2XL LVL4 (GOWN DISPOSABLE) IMPLANT
KIT BASIN OR (CUSTOM PROCEDURE TRAY) ×2 IMPLANT
KIT INFUSE MEDIUM (Orthopedic Implant) ×2 IMPLANT
KIT ROOM TURNOVER OR (KITS) ×2 IMPLANT
MARKER SKIN DUAL TIP RULER LAB (MISCELLANEOUS) ×2 IMPLANT
MASTERGRAFT MATX STRIP 36X2X.6 ×2 IMPLANT
MATRIX MASTERGRAFT STR 36X2X.6 ×1 IMPLANT
MILL MEDIUM DISP (BLADE) ×2 IMPLANT
MIX DBX 10CC 35% BONE (Bone Implant) ×2 IMPLANT
NEEDLE HYPO 25X1 1.5 SAFETY (NEEDLE) ×2 IMPLANT
NS IRRIG 1000ML POUR BTL (IV SOLUTION) ×2 IMPLANT
PACK LAMINECTOMY NEURO (CUSTOM PROCEDURE TRAY) ×2 IMPLANT
PAD ARMBOARD 7.5X6 YLW CONV (MISCELLANEOUS) ×6 IMPLANT
PATTIES SURGICAL 1X1 (DISPOSABLE) ×4 IMPLANT
Rod 300 mm ×4 IMPLANT
SCREW PEDICLE 5.5MMX40MM (Screw) ×12 IMPLANT
SCREW PEDICLE 6.5MMX35MM (Screw) ×4 IMPLANT
SCREW PEDICLE 6.5MMX45MM (Screw) ×12 IMPLANT
SCREW PEDICLE 6.5X40MM (Screw) ×4 IMPLANT
SCREW PEDICLE 6.5X45 (Screw) ×6 IMPLANT
SPACER CALIBER 10X22MM 8-12MM (Spacer) ×8 IMPLANT
SPACER CALIBER 10X22MM 9-13MM (Spacer) IMPLANT
SPACER CALIBER 10X26MM 8-12MM (Spacer) ×2 IMPLANT
SPONGE GAUZE 4X4 12PLY (GAUZE/BANDAGES/DRESSINGS) ×2 IMPLANT
SPONGE LAP 4X18 X RAY DECT (DISPOSABLE) ×2 IMPLANT
SPONGE SURGIFOAM ABS GEL 100 (HEMOSTASIS) ×6 IMPLANT
STRIP CLOSURE SKIN 1/2X4 (GAUZE/BANDAGES/DRESSINGS) ×2 IMPLANT
SUT VIC AB 0 CT1 18XCR BRD8 (SUTURE) ×3 IMPLANT
SUT VIC AB 0 CT1 8-18 (SUTURE) ×3
SUT VIC AB 2-0 CT1 18 (SUTURE) ×6 IMPLANT
SUT VICRYL 4-0 PS2 18IN ABS (SUTURE) ×4 IMPLANT
SYR 20ML ECCENTRIC (SYRINGE) ×2 IMPLANT
T CONNECTOR ADJ 48MM-61MM (Connector) ×2 IMPLANT
TOWEL OR 17X24 6PK STRL BLUE (TOWEL DISPOSABLE) ×2 IMPLANT
TOWEL OR 17X26 10 PK STRL BLUE (TOWEL DISPOSABLE) ×2 IMPLANT
TRAY FOLEY CATH 14FRSI W/METER (CATHETERS) ×2 IMPLANT
WATER STERILE IRR 1000ML POUR (IV SOLUTION) ×2 IMPLANT

## 2011-12-06 NOTE — H&P (Signed)
Donna French is an 61 y.o. female.   Chief Complaint: Back and leg pain HPI:  Patient very pleasant 61 year old in many years ago underwent foraminotomies on the left L5 and L5-S1 patient did very well however last several months and years a progress worsening back pain bilateral leg pain rate down lateral posterior thigh down below her knee since from her shin as well as back or calf in her foot with numbness tingling in the same distribution. Workup 3 years ago CT myelography showed severe degenerative disease with a degenerative scoliosis from L1-S1 ALT CT scan January showed persistent but progressive degenerative scoliosis and hent of progressive deterioration at T12-L1. Subsequent thoracic CT today show progressive deterioration at T12-L1 as well as it some progression and T11-T12. I have some slight recommending extending her fusion which was originally planned from L1-S1 up to T11 possibly T10 secondary to documentation of progressive deterioration scoliotic deformity degenerative disc disease at the levels at top of what considered to be. Our construct I feel strongly that stopping construct at L1 syndrome adult in a progressive potential catastrophic deterioration at T12-L1 and therefore needed to incorporate part lumbar junction the fusion the axes the cover this with the patient and her family reviewed the risks benefits of the operation as well as therapy course expectations about alternatives surgery and he understands and will proceed forward.  Past Medical History  Diagnosis Date  . Hypothyroidism   . Depression     Past Surgical History  Procedure Date  . Cervical fusion x2  . Tonsillectomy   . Appendectomy   . Abdominal hysterectomy   . Cholecystectomy   . Breast surgery     lumpectomy  . Lumbar laminectomy/decompression microdiscectomy     History reviewed. No pertinent family history. Social History:  reports that she has been smoking.  She does not have any smokeless  tobacco history on file. She reports that she does not drink alcohol or use illicit drugs.  Allergies:  Allergies  Allergen Reactions  . Codeine Nausea And Vomiting  . Darvon (Propoxyphene Hcl) Nausea And Vomiting  . Fish Allergy Itching and Swelling  . Sulfa Antibiotics Nausea And Vomiting    Medications Prior to Admission  Medication Sig Dispense Refill  . HYDROcodone-acetaminophen (VICODIN) 5-500 MG per tablet Take 1 tablet by mouth every 6 (six) hours as needed.      Marland Kitchen levothyroxine (SYNTHROID, LEVOTHROID) 88 MCG tablet Take 88 mcg by mouth daily.      . sertraline (ZOLOFT) 100 MG tablet Take 100 mg by mouth daily.        No results found for this or any previous visit (from the past 48 hour(s)). Ct Thoracic Spine Wo Contrast  12/06/2011  *RADIOLOGY REPORT*  Clinical Data: Preoperative study for patient with scoliosis. Planned fusion.  CT THORACIC SPINE WITHOUT CONTRAST  Technique:  Multidetector CT imaging of the thoracic spine was performed without intravenous contrast administration. Multiplanar CT image reconstructions were also generated  Comparison: None.  Findings: Vertebral body height and alignment are maintained.  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  Endplate sclerosis appears worst at T12-L1.  Although not as well demonstrated on CT scan, disc bulging appears most prominent at T4-5, T6-7 and T9-10. Imaged lung parenchyma is clear. Partial visualization of lower cervical fusion noted.  IMPRESSION: Multilevel degenerative change.  No fracture or other focal abnormality.  Original Report Authenticated By: Bernadene Bell. Maricela Curet, M.D.    Review  of Systems  Constitutional: Negative.   HENT: Negative.   Eyes: Negative.   Respiratory: Negative.   Cardiovascular: Negative.   Gastrointestinal: Negative.   Genitourinary: Negative.   Musculoskeletal: Positive for myalgias and back pain.  Skin: Negative.     Neurological: Positive for tingling.  Endo/Heme/Allergies: Negative.   Psychiatric/Behavioral: Negative.     Blood pressure 102/59, pulse 70, temperature 98 F (36.7 C), temperature source Oral, resp. rate 18, SpO2 98.00%. Physical Exam  Constitutional: She is oriented to person, place, and time. She appears well-developed and well-nourished.  Eyes: Pupils are equal, round, and reactive to light.  Neck: Normal range of motion.  Respiratory: Breath sounds normal.  GI: Soft.  Neurological: She is alert and oriented to person, place, and time. She has normal strength. GCS eye subscore is 4. GCS verbal subscore is 5. GCS motor subscore is 6.  Reflex Scores:      Tricep reflexes are 1+ on the right side and 1+ on the left side.      Bicep reflexes are 1+ on the right side and 1+ on the left side.      Brachioradialis reflexes are 1+ on the right side and 1+ on the left side.      Patellar reflexes are 0 on the right side and 0 on the left side.      Achilles reflexes are 0 on the right side and 0 on the left side.      Strength is 5 out of 5 in her iliopsoas, quadriceps, hamstrings, anterior tibialis, and gastrocnemius, and EHL. Sensation is decreased diffusely through multiple dermatomes in her lower extremities     Assessment/Plan 61 ago and presents for T11-S1 possibly T10-S1 posterior segmental spinal fusion with plates from Z6-1 L4-5 L5-S1 possible T. left at L2-3  Analena Gama P 12/06/2011, 11:17 AM

## 2011-12-06 NOTE — Anesthesia Postprocedure Evaluation (Signed)
  Anesthesia Post-op Note  Patient: Donna French  Procedure(s) Performed: Procedure(s) (LRB): POSTERIOR LUMBAR FUSION 4 LEVEL (N/A)  Patient Location: PACU  Anesthesia Type: General  Level of Consciousness: awake, alert  and oriented  Airway and Oxygen Therapy: Patient Spontanous Breathing and Patient connected to nasal cannula oxygen  Post-op Pain: mild  Post-op Assessment: Post-op Vital signs reviewed and Patient's Cardiovascular Status Stable  Post-op Vital Signs: stable  Complications: No apparent anesthesia complications

## 2011-12-06 NOTE — Progress Notes (Signed)
Report given to Kay L. RN  

## 2011-12-06 NOTE — Transfer of Care (Signed)
Immediate Anesthesia Transfer of Care Note  Patient: Donna French  Procedure(s) Performed: Procedure(s) (LRB): POSTERIOR LUMBAR FUSION 4 LEVEL (N/A)  Patient Location: PACU  Anesthesia Type: General  Level of Consciousness: sedated  Airway & Oxygen Therapy: Patient Spontanous Breathing and Patient connected to face mask oxygen  Post-op Assessment: Report given to PACU RN and Post -op Vital signs reviewed and stable  Post vital signs: stable  Complications: No apparent anesthesia complications

## 2011-12-06 NOTE — Op Note (Signed)
Preoperative diagnosis: Lumbar spinal stenosis L2-3, L3-4, L4-5, L5-S1. Bi- foraminal stenosis of the L. II nerve root, L3 nerve root, L4 nerve root, and L5 nerve root, and S1 nerve roots. Care disc disease at T12-L1, L1-L2, L2-3, L3-4, L4-5, and L5-S1. Degenerative scoliosis T11-S1.  Procedure: #1 decompressive lumbar laminectomy in excess will be needed with a standard interbody fusion at L2-3, L3-4, redo decompressive laminectomy L4-5 L5-S1 L4-5, L5-S1.  #2 TLIF L2- 3 from the right, posterior lumbar interbody fusion at L3-4, L4-5, L5-S1 using the globus caliber expandable peek cages packed with locally harvested autograft mixed with DBX and BMP  #3 pedicle screw fixation T10-S1 using the globus Revere 5.5 pedicle screw system with screws at T10, T11, T12, L2, L3, L4, L5, and S1.  #4 open reduction spinal deformity T11-S1  #5 posterior lateral arthrodesis T10-S1 using locally harvested autograft mixed with DBX and BMP  And placement of a large Hemovac drain  Surgeon: Jillyn Hidden Jilleen Essner  Assistant: Marikay Alar  Anesthesia: Gen.  EBL: 1200 cc  History of present illness: Patient very pleasant 61 year old female who had undergone previous foraminotomies at L4-5 L5-S1 many years ago initially did very well however over last several weeks and months and years she's had progressive worsening back and bilateral leg ring and posterior lateral thigh the front of her shin serial images or CT scans MRI scans myelography have revealed a progress tear process and the disc spaces from T12 down to S1 with degenerative scoliosis severe degenerative disc disease severe lumbar spinal stenosis at L2-3, L3-4, L4-5, L5-S1. And severe foraminal stenosis of the L2 nerve root on the right the L3 nerve roots L4 nerve roots L5 nerve roots and S1 nerve roots bilaterally. Due to patient's marked severity of her degenerative scoliosis and severe lumbar stenosis is a very to surgery but imaging findings and progression in nature  and neurogenic location patient was recommended decompression stabilization procedure with decompression from L2 down to S1 and stabilization from T10-S1 the risks benefits of the operation as well as perioperative course expectations about alternatives surgery were also the patient and she agreed to proceed forward.  Operative procedure: Patient brought in the or was induced under general anesthesia positioned prone the Wilson frame her back was prepped and draped in routine sterile fashion the old incision was opened up and extended several caudally scar tissues dissected free and the right-sided L4-5 and L5-S1. Subperiosteal dissections carried out bilaterally from T10 down to S1 exposing TPS all these levels the after interoperative x-ray confirmed the position appropriate level the decompression was begun spinous processes at L3-L4 and L5 were all removed central decompression was begun complete medial facetectomies were performed distally on the patient's right side work into the scar tissue and extending up to L2-3 was also decompressed from the right leading spinous process L2 intact. Dressing under biting of the superior tickling facet complexes allowed adequate decompression and aggressive foraminotomies of the L2-3-4 5 and S1 nerve roots on the right an L3-S1 left. There is marked facet arthropathy at all 3 levels causing severe hourglass compression of thecal sac this was all freed up and removed the margins disc space were freed up and all foramina were decompressed. At the end of the decompression to second pedicle screw placement using combination of AP and lateral fluoroscopy the thoracic pedicle screws were placed with a pilot hole drilled using AP fluoroscopy for landmarks as well as lateral fluoroscopy for Pap passing of the awl then using fluoroscopy as well  as bony landmarks each pedicle was probed tapped and 5 5 x 40 screws inserted at T10-T11 and T12 bilaterally he was elected not to put  screws in his L1 because the pedicles were small measuring less than 4.3 mm in width. Pedicle screws were also placed at L2-3-4 5 and S1 bilaterally and a similar fashion using the awl probing tap with 55 tap and 6 5 x 45 screws were inserted at L2-3 and 46 5 x 40 at L5-1 6 5  x 35 at S1 the potential made to get the S1 screws bicortical. After all screws been placed the interbody work was begun first working at L2-3 disc space was incised and cleanout pituitary rongeurs Epstein curettes and panel scrapers at 1 point using a rotating cutter at L2-3 patient admitted Dr. heart rate: Briefly a systolic we stopped lobe are doing with a rotating cutters immediately bounce back and was elected not to use a rotating cutters anymore do since lability with her heart rate with any manipulation of the thecal sac. After adequate pressures she is L2 through the right a minimal caliber cage measuring 8 mm expandable 12 Is in Pl. of vertebral compression placed across the midline to stand up approximately 3 clicks which puts at about 10 mm this was placed medially after packing a small piece of BMP and autograft anteriorly then at L3-4-1 similar fashion disc spaces cleanout using a distractor on one side and cleanout radically with Epstein curettes pituitary rongeurs 8 mm stable cages were inserted L3-4 8 mm L4-5 and 7 mm at L5-S1 WERE expanded to approximately 2 additional millimeters respectively and local are graft mixed with active use and BP was all packed centrally of the interbody spacer significantly reduced her scoliotic deformity at this level significantly straightened out her lumbar spine. After all interbody work been done the wound scope was irrigated meticulous in space was maintained all the foraminal reexplored to confirm decompression patency aggressive decortication was care MTPs or lateral gutters the remainder the BMP local autograft as well as master graft was inserted posterior laterally then 2 rods were cut  and contoured and placed a locking mechanisms were engaged compression was carried out at L5-S1 L4-5 L3-4 L2-3 as well as at T12-L2 on the patient's right side to help reduce her scoliotic deformity posterior fluoroscopy confirmed good position of the implant screws rods and significant reduction of her scoliosis so after adequate out reinspection of all foramina was carried out Gelfoam was laid up the dura large abductor was placed across it was applied and the wounds closed in layers with after Vicryl the skin was) 4 septic or benzoin Steri-Strips were applied patient recovered in stable condition at the end of case all needle counts and sponge counts were correct.

## 2011-12-06 NOTE — Anesthesia Preprocedure Evaluation (Signed)
Anesthesia Evaluation  Patient identified by MRN, date of birth, ID band Patient awake    Reviewed: Allergy & Precautions, H&P , NPO status , Patient's Chart, lab work & pertinent test results  Airway Mallampati: II  Neck ROM: full    Dental   Pulmonary Current Smoker,          Cardiovascular     Neuro/Psych Depression    GI/Hepatic   Endo/Other  Hypothyroidism   Renal/GU      Musculoskeletal   Abdominal   Peds  Hematology   Anesthesia Other Findings   Reproductive/Obstetrics                           Anesthesia Physical Anesthesia Plan  ASA: II  Anesthesia Plan: General   Post-op Pain Management:    Induction: Intravenous  Airway Management Planned: Oral ETT  Additional Equipment:   Intra-op Plan:   Post-operative Plan: Extubation in OR  Informed Consent: I have reviewed the patients History and Physical, chart, labs and discussed the procedure including the risks, benefits and alternatives for the proposed anesthesia with the patient or authorized representative who has indicated his/her understanding and acceptance.     Plan Discussed with: CRNA and Surgeon  Anesthesia Plan Comments:         Anesthesia Quick Evaluation

## 2011-12-06 NOTE — Preoperative (Signed)
Beta Blockers   Reason not to administer Beta Blockers:Not Applicable 

## 2011-12-07 LAB — CBC
Hemoglobin: 8.9 g/dL — ABNORMAL LOW (ref 12.0–15.0)
MCH: 31.6 pg (ref 26.0–34.0)
MCV: 93.6 fL (ref 78.0–100.0)
Platelets: 159 10*3/uL (ref 150–400)
RBC: 2.82 MIL/uL — ABNORMAL LOW (ref 3.87–5.11)

## 2011-12-07 LAB — BASIC METABOLIC PANEL
CO2: 26 mEq/L (ref 19–32)
Calcium: 7.5 mg/dL — ABNORMAL LOW (ref 8.4–10.5)
Chloride: 106 mEq/L (ref 96–112)
Glucose, Bld: 130 mg/dL — ABNORMAL HIGH (ref 70–99)
Potassium: 4.4 mEq/L (ref 3.5–5.1)
Sodium: 138 mEq/L (ref 135–145)

## 2011-12-07 MED ORDER — SODIUM CHLORIDE 0.9 % IV BOLUS (SEPSIS)
1000.0000 mL | Freq: Once | INTRAVENOUS | Status: AC
  Administered 2011-12-07: 1000 mL via INTRAVENOUS

## 2011-12-07 MED ORDER — HYDROMORPHONE HCL 2 MG PO TABS
4.0000 mg | ORAL_TABLET | ORAL | Status: DC | PRN
  Administered 2011-12-07 – 2011-12-12 (×6): 4 mg via ORAL
  Filled 2011-12-07 (×8): qty 2

## 2011-12-07 NOTE — Evaluation (Signed)
Physical Therapy Evaluation Patient Details Name: Donna French MRN: [id number] DOB: Aug 10, 1950 Today's Date: 12/07/2011 Time: 4098-1191 PT Time Calculation (min): 14 min  PT Assessment / Plan / Recommendation Clinical Impression  Pt s/p PLF L3-S1 with laminectomy L4-S1. Pt with limited functional mobility secondary to pain. Pt will benefit from skilled PT in the acute care setting in order to maximize functional mobility and safety prior to d/c    PT Assessment  Patient needs continued PT services    Follow Up Recommendations  No PT follow up;Supervision for mobility/OOB       Equipment Recommendations  Rolling walker with 5" wheels       Frequency Min 5X/week    Precautions / Restrictions Precautions Precautions: Back Precaution Booklet Issued: Yes (comment) Precaution Comments: pt educated on 3/3 back precautions Required Braces or Orthoses: Spinal Brace Spinal Brace: Lumbar corset;Applied in sitting position Restrictions Weight Bearing Restrictions: No         Mobility  Bed Mobility Bed Mobility: Not assessed Transfers Transfers: Sit to Stand;Stand to Sit Sit to Stand: 4: Min assist;With upper extremity assist;From bed Stand to Sit: 4: Min assist;With upper extremity assist;To chair/3-in-1 Details for Transfer Assistance: VC for proper sequencing and safety upon standing to maintain back precautions. Increased cueing for bending from knees instead of back for safety. Ambulation/Gait Ambulation/Gait Assistance: 4: Min assist Ambulation Distance (Feet): 20 Feet Assistive device: 1 person hand held assist Ambulation/Gait Assistance Details: VC for proper sequencing. Hand held assist for support. Distance limtied secondary to pt's pain Gait Pattern: Step-to pattern;Decreased stride length;Decreased hip/knee flexion - right;Decreased hip/knee flexion - left;Narrow base of support Gait velocity: decreased gait speed    Exercises     PT Diagnosis: Acute pain  PT  Problem List: Decreased activity tolerance;Decreased mobility;Decreased knowledge of use of DME;Decreased safety awareness;Decreased knowledge of precautions;Pain PT Treatment Interventions: DME instruction;Gait training;Stair training;Functional mobility training;Therapeutic activities;Therapeutic exercise;Patient/family education   PT Goals Acute Rehab PT Goals PT Goal Formulation: With patient Time For Goal Achievement: 12/14/11 Potential to Achieve Goals: Good Pt will Roll Supine to Right Side: with modified independence PT Goal: Rolling Supine to Right Side - Progress: Goal set today Pt will Roll Supine to Left Side: with modified independence PT Goal: Rolling Supine to Left Side - Progress: Goal set today Pt will go Supine/Side to Sit: with modified independence PT Goal: Supine/Side to Sit - Progress: Goal set today Pt will go Sit to Supine/Side: with modified independence PT Goal: Sit to Supine/Side - Progress: Goal set today Pt will go Sit to Stand: with modified independence PT Goal: Sit to Stand - Progress: Goal set today Pt will go Stand to Sit: with modified independence PT Goal: Stand to Sit - Progress: Goal set today Pt will Transfer Bed to Chair/Chair to Bed: with supervision PT Transfer Goal: Bed to Chair/Chair to Bed - Progress: Goal set today Pt will Ambulate: >150 feet;with supervision;with least restrictive assistive device PT Goal: Ambulate - Progress: Goal set today Pt will Go Up / Down Stairs: 6-9 stairs;with supervision;with rail(s) PT Goal: Up/Down Stairs - Progress: Goal set today  Visit Information  Last PT Received On: 12/07/11 Assistance Needed: +1    Subjective Data      Prior Functioning  Home Living Lives With: Spouse Available Help at Discharge: Family;Available 24 hours/day Type of Home: House Home Access: Level entry Home Layout: Two level;Laundry or work area in basement;Able to live on main level with bedroom/bathroom Alternate Electrical engineer of Steps:  13 Alternate Level Stairs-Rails: Right Bathroom Shower/Tub: Walk-in shower;Door [employer] Toilet: Standard Bathroom Accessibility: Yes How Accessible: Accessible via walker Home Adaptive Equipment: Built-in shower seat Prior Function Level of Independence: Independent Able to Take Stairs?: Yes Driving: Yes 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 Communication: No difficulties Dominant Hand: Right    Cognition  Overall Cognitive Status: Appears within functional limits for tasks assessed/performed Arousal/Alertness: Awake/alert Orientation Level: Appears intact for tasks assessed Behavior During Session: [facility] for tasks performed    Extremity/Trunk Assessment Right Lower Extremity Assessment RLE ROM/Strength/Tone: Within functional levels RLE Sensation: WFL - Light Touch Left Lower Extremity Assessment LLE ROM/Strength/Tone: Within functional levels LLE Sensation: WFL - Light Touch   Balance    End of Session PT - End of Session Equipment Utilized During Treatment: Gait belt;Back brace Activity Tolerance: Patient limited by pain Patient left: in chair;with call bell/phone within reach;with family/visitor present Nurse Communication: Mobility status    Milana Kidney 12/07/2011, 5:09 PM  12/07/2011 Milana Kidney DPT PAGER: [phone] OFFICE: [phone]

## 2011-12-07 NOTE — Progress Notes (Signed)
Utilization review completed.  

## 2011-12-07 NOTE — Progress Notes (Signed)
Subjective: Patient reports That she's doing okay she has no pain or legs no numbness tingling or legs or feet her back was very sore  Objective: Vital signs in last 24 hours: Temp:  [97.5 F (36.4 C)-98.1 F (36.7 C)] 97.7 F (36.5 C) (07/11 0330) Pulse Rate:  [68-86] 72  (07/11 0330) Resp:  [12-18] 16  (07/11 0330) BP: (89-111)/(38-62) 98/41 mmHg (07/11 0330) SpO2:  [97 %-100 %] 98 % (07/11 0330) Arterial Line BP: (77-96)/(43-57) 77/57 mmHg (07/11 0330) Weight:  [57.3 kg (126 lb 5.2 oz)] 57.3 kg (126 lb 5.2 oz) (07/10 2310)  Intake/Output from previous day: 07/10 0701 - 07/11 0700 In: 5120 [I.V.:4200; Blood:420; IV Piggyback:500] Out: 2260 [Urine:635; Drains:425; Blood:1200] Intake/Output this shift: Total I/O In: 200 [I.V.:200] Out: 780 [Urine:255; Drains:425; Blood:100]  Wound is clean and dry strength is 5 out of 5 in her lower extremities  Lab Results:  [facility] 12/07/11 0455 12/06/11 2026  WBC 13.4* 17.3*  HGB 8.9* 10.7*  HCT 26.4* 31.3*  PLT 159 194   BMET  Basename 12/07/11 0455  NA 138  K 4.4  CL 106  CO2 26  GLUCOSE 130*  BUN 15  CREATININE 0.57  CALCIUM 7.5*    Studies/Results: Dg Lumbar Spine Complete  12/06/2011  *RADIOLOGY REPORT*  Clinical Data: Posterior fusion.  DG C-ARM GT 120 MIN,LUMBAR SPINE - COMPLETE 4+ VIEW  Comparison: 06/08/2011  Findings: Thoracolumbar fusion is noted.  Bilateral pedicle screws are in place at T10, T11, T12, L2, L3, L4, L5, and S1.  This spacers are present at L5-S1, L4-5, L3-4, L2-3.  Cross stabilizing bars are in place for fusion.  No breakage or loosening of the hardware.  Anatomic alignment.  Stable appearance of the vertebral bodies.  IMPRESSION:  T10-S1 fusion as described.  Original Report Authenticated By: Donavan Burnet, M.D.   Ct Thoracic Spine Wo Contrast  12/06/2011  *RADIOLOGY REPORT*  Clinical Data: Preoperative study for patient with scoliosis. Planned fusion.  CT THORACIC SPINE WITHOUT CONTRAST   Technique:  Multidetector CT imaging of the thoracic spine was performed without intravenous contrast administration. Multiplanar CT image reconstructions were also generated  Comparison: None.  Findings: Vertebral body height and alignment are maintained.  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  Endplate sclerosis appears worst at T12-L1.  Although not as well demonstrated on CT scan, disc bulging appears most prominent at T4-5, T6-7 and T9-10. Imaged lung parenchyma is clear. Partial visualization of lower cervical fusion noted.  IMPRESSION: Multilevel degenerative change.  No fracture or other focal abnormality.  Original Report Authenticated By: Bernadene Bell. Maricela Curet, M.D.   Dg C-arm Leonia Reeves 120 Min  12/06/2011   Original Report Authenticated By: Gwynn Burly, M.D.    Assessment/Plan: Posterior day 1 from T10-S1 fusion overall she's doing very well blood pressure is low but lobe given her liters saline bolus. Urine output also is borderline at 250 over last shift. Her postop hematocrit was 26 we have labs pending for this morning continue to observe those should she get down below 25 I will probably transfuse her. We will consider mobilization mobilize her today with physical therapy lever she doesn't feel up to it today we'll await tomorrow. We will be contacting by Dr. to locate her brace.  LOS: 1 day     Amit Leece P 12/07/2011, 6:56 AM

## 2011-12-07 NOTE — Progress Notes (Signed)
Orthopedic Tech Progress Note Patient Details:  Donna French 03-Jan-1951 [id number]  Patient ID: [name], female   DOB: 02-07-1951, 61 y.o.   MRN: [id number] Recvd call from 3300 regarding pt not receiving brace as ordered. Checked Neuro Pacu, brace was located. Delivered brace to pt in room 3315.  Leo Grosser T 12/07/2011, 12:33 PM

## 2011-12-08 MED ORDER — DIAZEPAM 5 MG PO TABS
5.0000 mg | ORAL_TABLET | Freq: Three times a day (TID) | ORAL | Status: DC | PRN
  Administered 2011-12-09 – 2011-12-12 (×4): 5 mg via ORAL
  Filled 2011-12-08 (×5): qty 1

## 2011-12-08 MED ORDER — FENTANYL 25 MCG/HR TD PT72
50.0000 ug | MEDICATED_PATCH | TRANSDERMAL | Status: DC
  Administered 2011-12-08 – 2011-12-11 (×2): 50 ug via TRANSDERMAL
  Filled 2011-12-08: qty 2
  Filled 2011-12-08: qty 1

## 2011-12-08 MED FILL — Sodium Chloride IV Soln 0.9%: INTRAVENOUS | Qty: 1000 | Status: AC

## 2011-12-08 MED FILL — Heparin Sodium (Porcine) Inj 1000 Unit/ML: INTRAMUSCULAR | Qty: 30 | Status: AC

## 2011-12-08 MED FILL — Sodium Chloride Irrigation Soln 0.9%: Qty: 3000 | Status: AC

## 2011-12-08 NOTE — Progress Notes (Signed)
Occupational Therapy Evaluation Patient Details Name: Donna French MRN: [id number] DOB: [birth year] Today's Date: 12/08/2011 Time: 7829-5621 OT Time Calculation (min): 18 min  OT Assessment / Plan / Recommendation Clinical Impression  61 y.o. pt s/p PLF L3-S1 with laminectomy L4-S1. Pt with limited functional mobility secondary to pain. Pt will benefit from skilled OT to maximize independence and safety in ADLs prior to d/c.     OT Assessment  Patient needs continued OT Services    Follow Up Recommendations  No OT follow up    Barriers to Discharge      Equipment Recommendations   (TBD)    Recommendations for Other Services    Frequency  Min 2X/week    Precautions / Restrictions Precautions Precautions: Back Precaution Comments: educated pt. on 3/3 back precautions Required Braces or Orthoses: Spinal Brace Spinal Brace: Lumbar corset;Applied in sitting position Restrictions Weight Bearing Restrictions: No   Pertinent Vitals/Pain Vitals Monitored and Stable. Reported pain 20/10 in back. Nurse notified.    ADL  Lower Body Bathing: Simulated;+1 Total assistance Where Assessed - Lower Body Bathing: Supported sitting Lower Body Dressing: Simulated;+1 Total assistance Where Assessed - Lower Body Dressing: Supported sitting Toilet Transfer: Simulated;Min Pension scheme manager Method: Sit to Barista: Raised toilet seat with arms (or 3-in-1 over toilet) Equipment Used: Gait belt Transfers/Ambulation Related to ADLs: Pt. required Min A for ambulation due to decreased balance secondary to pain ADL Comments: Pt. performed sit to stand and stand to sit from chair with Min G assist. With ambulation pt. requires Min A for balance. Pt. was not able to verbalize all back precautions consistently. Simulated LB dressing and pt. was unable to cross legs to don socks. Pt. was educated on adaptive equipment such as sockaid and reacher that may be benefiicial for more  independence in dressing. Pt. and husband verbalized they think this may be a waste of money, but are willing to see the equipment and then decide. OT mentioned that a walker would be useful for pt., but husband stated that she does not need a walker.     OT Diagnosis: Acute pain  OT Problem List: Impaired balance (sitting and/or standing);Pain;Decreased knowledge of use of DME or AE OT Treatment Interventions: Self-care/ADL training;DME and/or AE instruction;Patient/family education;Balance training   OT Goals Acute Rehab OT Goals OT Goal Formulation: With patient Time For Goal Achievement: 12/22/11 Potential to Achieve Goals: Good ADL Goals Pt Will Perform Grooming: with modified independence;Standing at sink ADL Goal: Grooming - Progress: Goal set today Pt Will Perform Upper Body Bathing: with modified independence;Sitting at sink ADL Goal: Upper Body Bathing - Progress: Goal set today Pt Will Perform Lower Body Bathing: Sit to stand from chair;with modified independence ADL Goal: Lower Body Bathing - Progress: Goal set today Pt Will Perform Upper Body Dressing: with modified independence;Sitting, bed;Sitting, chair ADL Goal: Upper Body Dressing - Progress: Goal set today Pt Will Perform Lower Body Dressing: with modified independence;Sit to stand from bed;Sit to stand from chair ADL Goal: Lower Body Dressing - Progress: Goal set today Pt Will Transfer to Toilet: with modified independence;Ambulation ADL Goal: Toilet Transfer - Progress: Goal set today Pt Will Perform Toileting - Clothing Manipulation: with modified independence;Sitting on 3-in-1 or toilet ADL Goal: Toileting - Clothing Manipulation - Progress: Goal set today Pt Will Perform Toileting - Hygiene: with modified independence;Sitting on 3-in-1 or toilet ADL Goal: Toileting - Hygiene - Progress: Goal set today Pt Will Perform Tub/Shower Transfer: Shower transfer;Ambulation ADL Goal:  Tub/Shower Transfer - Progress: Goal set  today Miscellaneous OT Goals Miscellaneous OT Goal #1: Pt. will verbalize 3/3 back precautions. OT Goal: Miscellaneous Goal #1 - Progress: Goal set today  Visit Information  Last OT Received On: 12/08/11 Assistance Needed: +1    Subjective Data  Subjective: Pt. was pleasant in session.   Prior Functioning  Vision/Perception  Home Living Lives With: Spouse Available Help at Discharge: Family;Available 24 hours/day Type of Home: House Home Access: Level entry Home Layout: Two level;Laundry or work area in basement;Able to live on main level with bedroom/bathroom Alternate Teacher, music of Steps: 13 Alternate Level Stairs-Rails: Right Bathroom Shower/Tub: Psychologist, counselling;Door [employer] Toilet: Standard Bathroom Accessibility: Yes How Accessible: Accessible via walker Home Adaptive Equipment: Built-in shower seat (mother has rolling walker she could use) Prior Function Level of Independence: Independent Able to Take Stairs?: Yes Driving: Yes 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 Communication: No difficulties Dominant Hand: Right      Cognition  Overall Cognitive Status: Appears within functional limits for tasks assessed/performed Arousal/Alertness: Awake/alert Orientation Level: Appears intact for tasks assessed Behavior During Session: [facility] for tasks performed    Extremity/Trunk Assessment Right Upper Extremity Assessment RUE ROM/Strength/Tone: [facility] for tasks assessed (grossly assessed) Left Upper Extremity Assessment LUE ROM/Strength/Tone: WFL for tasks assessed (grossly assessed)   Mobility Bed Mobility Bed Mobility: Not assessed Transfers Transfers: Sit to Stand;Stand to Sit Sit to Stand: 4: Min guard;With upper extremity assist;From chair/3-in-1 Stand to Sit: 4: Min guard;With upper extremity assist;To chair/3-in-1           End of Session OT - End of Session Activity Tolerance: Patient tolerated treatment well;Patient  limited by pain Patient left: in chair;with call bell/phone within reach;with family/visitor present Nurse Communication: Other (comment) (pain level)  GO     Jenell Milliner 12/08/2011, 12:49 PM

## 2011-12-08 NOTE — Progress Notes (Signed)
I agree with the following treatment note after reviewing documentation.   Johnston, Damiyah Ditmars Brynn   OTR/L Pager: [phone] Office: [phone] .   

## 2011-12-08 NOTE — Progress Notes (Signed)
Subjective: Patient reports She's doing well except severe back pain she denies any leg pain denies any numbness tingling she is voiding spontaneously  Objective: Vital signs in last 24 hours: Temp:  [97.6 F (36.4 C)-99.3 F (37.4 C)] 99.2 F (37.3 C) (07/12 0341) Pulse Rate:  [82-86] 86  (07/11 1600) Resp:  [19-22] 19  (07/12 0341) BP: (94-110)/(31-82) 109/31 mmHg (07/12 0341) SpO2:  [98 %-100 %] 98 % (07/12 0341)  Intake/Output from previous day: 07/11 0701 - 07/12 0700 In: 515 [P.O.:240] Out: 755 [Urine:300; Drains:455] Intake/Output this shift:    Strength is 5 out of 5 wound is clean and dry drain output significantly decreased 170/at.  Lab Results:  Basename 12/07/11 0455 12/06/11 2026  WBC 13.4* 17.3*  HGB 8.9* 10.7*  HCT 26.4* 31.3*  PLT 159 194   BMET  Basename 12/07/11 0455  NA 138  K 4.4  CL 106  CO2 26  GLUCOSE 130*  BUN 15  CREATININE 0.57  CALCIUM 7.5*    Studies/Results: Dg Lumbar Spine Complete  12/06/2011  *RADIOLOGY REPORT*  Clinical Data: Posterior fusion.  DG C-ARM GT 120 MIN,LUMBAR SPINE - COMPLETE 4+ VIEW  Comparison: 06/08/2011  Findings: Thoracolumbar fusion is noted.  Bilateral pedicle screws are in place at T10, T11, T12, L2, L3, L4, L5, and S1.  This spacers are present at L5-S1, L4-5, L3-4, L2-3.  Cross stabilizing bars are in place for fusion.  No breakage or loosening of the hardware.  Anatomic alignment.  Stable appearance of the vertebral bodies.  IMPRESSION:  T10-S1 fusion as described.  Original Report Authenticated By: Donavan Burnet, M.D.   Ct Thoracic Spine Wo Contrast  12/06/2011  *RADIOLOGY REPORT*  Clinical Data: Preoperative study for patient with scoliosis. Planned fusion.  CT THORACIC SPINE WITHOUT CONTRAST  Technique:  Multidetector CT imaging of the thoracic spine was performed without intravenous contrast administration. Multiplanar CT image reconstructions were also generated  Comparison: None.  Findings: Vertebral body  height and alignment are maintained.  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  Endplate sclerosis appears worst at T12-L1.  Although not as well demonstrated on CT scan, disc bulging appears most prominent at T4-5, T6-7 and T9-10. Imaged lung parenchyma is clear. Partial visualization of lower cervical fusion noted.  IMPRESSION: Multilevel degenerative change.  No fracture or other focal abnormality.  Original Report Authenticated By: Bernadene Bell. Maricela Curet, M.D.   Dg C-arm Leonia Reeves 120 Min  12/06/2011   Original Report Authenticated By: Gwynn Burly, M.D.    Assessment/Plan: Posterior day 2 from T10-S1 fusion mobilizing actually extremely well in her brace and living and voiding severe low back pain no leg pain well manipulate some of her pain medication and try improve her pain management.  LOS: 2 days     Kazimir Hartnett P 12/08/2011, 7:27 AM

## 2011-12-08 NOTE — Evaluation (Signed)
Physical Therapy Evaluation Patient Details Name: Donna French MRN: [id number] DOB: February 05, 1951 Today's Date: 12/08/2011 Time: 4098-1191 PT Time Calculation (min): 43 min  PT Assessment / Plan / Recommendation Clinical Impression       PT Assessment       Follow Up Recommendations  No PT follow up;Supervision for mobility/OOB;Home health PT (HHPT depending on progress)    Barriers to Discharge        Equipment Recommendations  Rolling walker with 5" wheels    Recommendations for Other Services     Frequency Min 5X/week    Precautions / Restrictions Precautions Precautions: Back Precaution Booklet Issued: Yes (comment) Precaution Comments: educated pt. on 3/3 back precautions Required Braces or Orthoses: Spinal Brace Spinal Brace: Lumbar corset;Applied in sitting position Restrictions Weight Bearing Restrictions: No   Pertinent Vitals/Pain       Mobility  Bed Mobility Bed Mobility: Rolling Right;Right Sidelying to Sit Rolling Right: 4: Min assist Right Sidelying to Sit: 4: Min assist Details for Bed Mobility Assistance: vc's for technique and safety; truncal assist Transfers Transfers: Sit to Stand;Stand to Sit Sit to Stand: 4: Min guard;With upper extremity assist;From bed Stand to Sit: 4: Min assist;With upper extremity assist;To toilet Details for Transfer Assistance: VC for proper sequencing and safety upon standing to maintain back precautions. Increased cueing for bending from knees instead of back for safety. Ambulation/Gait Ambulation/Gait Assistance: 4: Min assist Ambulation Distance (Feet): 140 Feet Assistive device: Rolling walker Ambulation/Gait Assistance Details: mildly unsteady with RW (likely due to meds); vc's for  Gait Pattern: Decreased stride length;Narrow base of support;Step-through pattern;Decreased step length - right;Decreased step length - left Gait velocity: decreased gait speed    Exercises     PT Diagnosis:    PT Problem List:     PT Treatment Interventions:     PT Goals Acute Rehab PT Goals Time For Goal Achievement: 12/14/11 Potential to Achieve Goals: Good PT Goal: Rolling Supine to Right Side - Progress: Progressing toward goal PT Goal: Rolling Supine to Left Side - Progress: Progressing toward goal PT Goal: Supine/Side to Sit - Progress: Progressing toward goal PT Goal: Sit to Supine/Side - Progress: Progressing toward goal PT Goal: Sit to Stand - Progress: Progressing toward goal PT Goal: Stand to Sit - Progress: Progressing toward goal PT Transfer Goal: Bed to Chair/Chair to Bed - Progress: Progressing toward goal PT Goal: Ambulate - Progress: Progressing toward goal  Visit Information  Last PT Received On: 12/08/11 Assistance Needed: +1    Subjective Data  Subjective: I feel fine...   Prior Functioning       Cognition  Overall Cognitive Status: Appears within functional limits for tasks assessed/performed Arousal/Alertness: Awake/alert Orientation Level: Appears intact for tasks assessed Behavior During Session: [facility] for tasks performed    Extremity/Trunk Assessment     Balance Balance Balance Assessed: No  End of Session PT - End of Session Equipment Utilized During Treatment: Gait belt;Back brace Activity Tolerance: Patient tolerated treatment well Patient left: in bed;with call bell/phone within reach;with family/visitor present Nurse Communication: Mobility status  GP     Leor Whyte, Eliseo Gum 12/08/2011, 2:39 PM  12/08/2011  [place], PT [phone] (pager)

## 2011-12-09 LAB — CBC WITH DIFFERENTIAL/PLATELET
Eosinophils Relative: 0 % (ref 0–5)
HCT: 20.1 % — ABNORMAL LOW (ref 36.0–46.0)
Hemoglobin: 6.7 g/dL — CL (ref 12.0–15.0)
Lymphocytes Relative: 6 % — ABNORMAL LOW (ref 12–46)
Lymphs Abs: 0.8 10*3/uL (ref 0.7–4.0)
MCV: 94.8 fL (ref 78.0–100.0)
Monocytes Relative: 11 % (ref 3–12)
Platelets: 135 10*3/uL — ABNORMAL LOW (ref 150–400)
RBC: 2.12 MIL/uL — ABNORMAL LOW (ref 3.87–5.11)
WBC: 13.7 10*3/uL — ABNORMAL HIGH (ref 4.0–10.5)

## 2011-12-09 LAB — PREPARE RBC (CROSSMATCH)

## 2011-12-09 NOTE — Progress Notes (Signed)
Physical Therapy Treatment Patient Details Name: Donna French MRN: [id number] DOB: 1950/07/23 Today's Date: 12/09/2011 Time: 1478-2956 PT Time Calculation (min): 24 min  PT Assessment / Plan / Recommendation Comments on Treatment Session  Pt moving well this session although limited secondary to decreased Hgb, plan for transfusion this afternoon. Will continue per plan pending medical stability.    Follow Up Recommendations  Home health PT;Supervision for mobility/OOB    Barriers to Discharge        Equipment Recommendations  Rolling walker with 5" wheels    Recommendations for Other Services    Frequency Min 5X/week   Plan Discharge plan remains appropriate;Frequency remains appropriate    Precautions / Restrictions Precautions Precautions: Back Precaution Comments: educated pt. on 3/3 back precautions Required Braces or Orthoses: Spinal Brace Spinal Brace: Lumbar corset;Applied in sitting position Restrictions Weight Bearing Restrictions: No       Mobility  Bed Mobility Bed Mobility: Rolling Right;Right Sidelying to Sit;Sitting - Scoot to Edge of Bed Rolling Right: 4: Min guard Right Sidelying to Sit: 4: Min assist;HOB flat Sitting - Scoot to Edge of Bed: 4: Min guard Details for Bed Mobility Assistance: vc's for bed mobility and technique. Physical assist with trunk Transfers Transfers: Sit to Stand;Stand to Sit Sit to Stand: 4: Min guard;With upper extremity assist;From bed Stand to Sit: 4: Min guard;With upper extremity assist;To chair/3-in-1 Details for Transfer Assistance: VC for proper hand placement and sequencing. Minguard during descent for support Ambulation/Gait Ambulation/Gait Assistance: 4: Min guard Ambulation Distance (Feet): 20 Feet Assistive device: Rolling walker Ambulation/Gait Assistance Details: Pt with decreased Hgb this session therefore requested by RN to ambulate to chair only. Cues for safety with RW throughout session.  Gait Pattern:  Decreased stride length;Narrow base of support;Step-through pattern;Decreased step length - right;Decreased step length - left Gait velocity: decreased gait speed    Exercises     PT Diagnosis:    PT Problem List:   PT Treatment Interventions:     PT Goals Acute Rehab PT Goals PT Goal: Rolling Supine to Right Side - Progress: Progressing toward goal PT Goal: Rolling Supine to Left Side - Progress: Progressing toward goal PT Goal: Supine/Side to Sit - Progress: Progressing toward goal PT Goal: Sit to Supine/Side - Progress: Progressing toward goal PT Goal: Sit to Stand - Progress: Progressing toward goal PT Goal: Stand to Sit - Progress: Progressing toward goal PT Transfer Goal: Bed to Chair/Chair to Bed - Progress: Progressing toward goal PT Goal: Ambulate - Progress: Progressing toward goal  Visit Information  Last PT Received On: 12/09/11 Assistance Needed: +1 PT/OT Co-Evaluation/Treatment: Yes    Subjective Data      Cognition  Overall Cognitive Status: Appears within functional limits for tasks assessed/performed Arousal/Alertness: Awake/alert Orientation Level: Appears intact for tasks assessed Behavior During Session: [facility] for tasks performed    Balance     End of Session PT - End of Session Equipment Utilized During Treatment: Gait belt;Back brace Activity Tolerance: Treatment limited secondary to medical complications (Comment) (decreased Hgb) Patient left: in chair;with call bell/phone within reach;with family/visitor present Nurse Communication: Mobility status   Milana Kidney 12/09/2011, 11:13 AM  12/09/2011 Milana Kidney DPT PAGER: [phone] OFFICE: [phone]

## 2011-12-09 NOTE — Progress Notes (Signed)
Occupational Therapy Treatment Patient Details Name: Donna French MRN: [id number] DOB: [birth year] Today's Date: 12/09/2011 Time: 7829-5621 OT Time Calculation (min): 44 min  OT Assessment / Plan / Recommendation Comments on Treatment Session Pt. did well in session and is progressing towards goals.     Follow Up Recommendations  No OT follow up    Barriers to Discharge       Equipment Recommendations  Rolling walker with 5" wheels    Recommendations for Other Services    Frequency Min 2X/week   Plan Discharge plan remains appropriate    Precautions / Restrictions Precautions Precautions: Back Precaution Comments: educated pt. on 3/3 back precautions Required Braces or Orthoses: Spinal Brace Spinal Brace: Lumbar corset;Applied in sitting position Restrictions Weight Bearing Restrictions: No   Pertinent Vitals/Pain Pt. Reported pain 8/10 in back, but pain decreased by end of session. O2 sats decreased to 70's after bed mobility on room air, but pt. O2 maintained in 90's during ambulation to chair on room air.      ADL  Grooming: Performed;Wash/dry face;Set up Where Assessed - Grooming: Supported sitting Upper Body Dressing: Performed;Maximal assistance (donned brace) Where Assessed - Upper Body Dressing: Supported sitting Lower Body Dressing: Performed;Moderate assistance Where Assessed - Lower Body Dressing: Unsupported sitting Toilet Transfer: Simulated;Min guard Toilet Transfer Method: Sit to Barista: Regular height toilet Equipment Used: Rolling walker  ADL Comments: Pt. sat on EOB and donned Lt. sock with Min A and verbal cues to follow back precautions. Pt. was unable to cross Rt. leg to don sock. OT educated pt. on sock aid and pt. demonstrated use of sock aid with Mod A and vc's to follow back precautions. Pt. verbalized that her husband will help with donning socks and does not want the sock aid. Pt. ambulated to chair with Min G  assist.Pt. unable to consistently verbalize all back precautions. Pt. donned brace with Max A. OT educated husband on proper positioning of back brace.         OT Goals Acute Rehab OT Goals OT Goal Formulation: With patient Time For Goal Achievement: 12/22/11 Potential to Achieve Goals: Good ADL Goals Pt Will Perform Grooming: with modified independence;Standing at sink ADL Goal: Grooming - Progress: Progressing toward goals Pt Will Perform Upper Body Bathing: with modified independence;Sitting at sink Pt Will Perform Lower Body Bathing: Sit to stand from chair;with modified independence Pt Will Perform Upper Body Dressing: with modified independence;Sitting, bed;Sitting, chair ADL Goal: Upper Body Dressing - Progress: Progressing toward goals Pt Will Perform Lower Body Dressing: with modified independence;Sit to stand from bed;Sit to stand from chair ADL Goal: Lower Body Dressing - Progress: Progressing toward goals Pt Will Transfer to Toilet: with modified independence;Ambulation ADL Goal: Toilet Transfer - Progress: Progressing toward goals Pt Will Perform Toileting - Clothing Manipulation: with modified independence;Sitting on 3-in-1 or toilet Pt Will Perform Toileting - Hygiene: with modified independence;Sitting on 3-in-1 or toilet Pt Will Perform Tub/Shower Transfer: Shower transfer;Ambulation Miscellaneous OT Goals Miscellaneous OT Goal #1: Pt. will verbalize 3/3 back precautions. OT Goal: Miscellaneous Goal #1 - Progress: Progressing toward goals  Visit Information  Last OT Received On: 12/09/11 Assistance Needed: +1    Subjective Data   Discussed AE with pt.    Prior Functioning       Cognition  Overall Cognitive Status: Appears within functional limits for tasks assessed/performed Arousal/Alertness: Awake/alert Orientation Level: Appears intact for tasks assessed Behavior During Session: [facility] for tasks performed    Mobility Bed  Mobility Bed Mobility: Rolling  Right;Right Sidelying to Sit;Sitting - Scoot to Edge of Bed Rolling Right: 4: Min guard Right Sidelying to Sit: 4: Min assist;HOB flat Sitting - Scoot to Edge of Bed: 4: Min guard Details for Bed Mobility Assistance: vc's for bed mobility and technique. Physical assist with trunk Transfers Transfers: Sit to Stand;Stand to Sit Sit to Stand: 4: Min guard;With upper extremity assist;From bed Stand to Sit: 4: Min guard;With upper extremity assist;To chair/3-in-1           End of Session OT - End of Session Activity Tolerance: Patient tolerated treatment well Patient left: with call bell/phone within reach;in chair;with family/visitor present  GO     Jenell Milliner 12/09/2011, 10:28 AM

## 2011-12-09 NOTE — Progress Notes (Signed)
I agree with the following treatment note after reviewing documentation.   Johnston, Nisaiah Bechtol Brynn   OTR/L Pager: [phone] Office: [phone] .   

## 2011-12-09 NOTE — Progress Notes (Signed)
Pt temp increased from 99.4 to 100.4 during first unit of PRBC. MD notified, new orders received. Will continue to monitor.

## 2011-12-09 NOTE — Progress Notes (Signed)
Subjective: Patient reports This morning a has not changed in character she has a lot of pain this morning it has not changed in character is slightly more intense as morning she's been more active prophylaxis 24 hours  Objective: Vital signs in last 24 hours: Temp:  [98 F (36.7 C)-99.6 F (37.6 C)] 98.5 F (36.9 C) (07/13 0726) Pulse Rate:  [89-102] 94  (07/13 0441) Resp:  [15-25] 15  (07/13 0000) BP: (94-107)/(38-54) 100/39 mmHg (07/13 0441) SpO2:  [94 %-100 %] 98 % (07/13 0441)  Intake/Output from previous day: 07/12 0701 - 07/13 0700 In: 1487 [P.O.:1320; IV Piggyback:152] Out: 0  Intake/Output this shift:    Is awake alert patient is awake alert oriented strength is 5 out of 5 in her lower 70s she still denies any pain numbness or tingling in her legs she is voiding spontaneously  Lab Results:  Basename 12/07/11 0455 12/06/11 2026  WBC 13.4* 17.3*  HGB 8.9* 10.7*  HCT 26.4* 31.3*  PLT 159 194   BMET  Basename 12/07/11 0455  NA 138  K 4.4  CL 106  CO2 26  GLUCOSE 130*  BUN 15  CREATININE 0.57  CALCIUM 7.5*    Studies/Results: No results found.  Assessment/Plan: Patient transferred to the floor continue PT continue to patch continue fentanyl patch maintain her IV fluids she still has an ileus. CBC today  LOS: 3 days     Alliyah Roesler P 12/09/2011, 7:35 AM

## 2011-12-10 LAB — TYPE AND SCREEN: Unit division: 0

## 2011-12-10 MED ORDER — BISACODYL 10 MG RE SUPP
10.0000 mg | Freq: Every day | RECTAL | Status: DC | PRN
  Administered 2011-12-13: 10 mg via RECTAL
  Filled 2011-12-10: qty 1

## 2011-12-10 MED ORDER — LACTULOSE 10 GM/15ML PO SOLN
10.0000 g | Freq: Every day | ORAL | Status: DC | PRN
  Administered 2011-12-11 – 2011-12-12 (×2): 10 g via ORAL
  Filled 2011-12-10 (×2): qty 15

## 2011-12-10 NOTE — Progress Notes (Signed)
Physical Therapy Treatment Patient Details Name: Donna French MRN: [id number] DOB: [birth year] Today's Date: 12/10/2011 Time: 4403-4742 PT Time Calculation (min): 38 min  PT Assessment / Plan / Recommendation Comments on Treatment Session  Pt able to ambulate longer distance this session, although still felt lightheaded and weak towards end of ambulation. Will continue per plan for further ambulation and possibly stairs prior to d/c home    Follow Up Recommendations  Home health PT;Supervision for mobility/OOB    Barriers to Discharge        Equipment Recommendations  Rolling walker with 5" wheels    Recommendations for Other Services    Frequency Min 5X/week   Plan Discharge plan remains appropriate;Frequency remains appropriate    Precautions / Restrictions Precautions Precautions: Back Precaution Comments: pt able to verbalize 2/3 back precautions. Min cueing throughout session for safety Required Braces or Orthoses: Spinal Brace Spinal Brace: Lumbar corset;Applied in sitting position Restrictions Weight Bearing Restrictions: No       Mobility  Bed Mobility Bed Mobility: Not assessed Transfers Transfers: Sit to Stand;Stand to Sit Sit to Stand: 4: Min guard;With upper extremity assist;From chair/3-in-1 Stand to Sit: 4: Min guard;With upper extremity assist;To chair/3-in-1 Details for Transfer Assistance: VC for hand placement and proper sequencing for safety upon descent.  Ambulation/Gait Ambulation/Gait Assistance: 4: Min guard Ambulation Distance (Feet): 300 Feet Assistive device: Other (Comment) (IV pole) Ambulation/Gait Assistance Details: Minguard assist with IV pole this session, RW too tall. Will attempt RW at good height next session. Pt with slower gait speed and less knee/hip flexion towards end of ambulation trial secondary to fatigue. VC for proper sequencing throughout and safety to maintain back precautions during turning(prevent twisting). Gait Pattern:  Decreased stride length;Narrow base of support;Decreased hip/knee flexion - right;Decreased hip/knee flexion - left;Step-to pattern Gait velocity: decreased gait speed    Exercises      PT Goals Acute Rehab PT Goals PT Goal: Sit to Stand - Progress: Progressing toward goal PT Goal: Stand to Sit - Progress: Progressing toward goal PT Transfer Goal: Bed to Chair/Chair to Bed - Progress: Progressing toward goal PT Goal: Ambulate - Progress: Progressing toward goal  Visit Information  Last PT Received On: 12/10/11 Assistance Needed: +1 PT/OT Co-Evaluation/Treatment: Yes    Subjective Data      Cognition  Overall Cognitive Status: Appears within functional limits for tasks assessed/performed Arousal/Alertness: Awake/alert Orientation Level: Appears intact for tasks assessed Behavior During Session: [facility] for tasks performed    Balance     End of Session PT - End of Session Equipment Utilized During Treatment: Gait belt;Back brace Activity Tolerance: Patient limited by fatigue Patient left: in chair;with call bell/phone within reach;with family/visitor present Nurse Communication: Mobility status   Milana Kidney 12/10/2011, 9:53 AM

## 2011-12-10 NOTE — Progress Notes (Signed)
Pt transferred to 4N 32, per MD order. Report called to receiving nurse and all questions answered.

## 2011-12-10 NOTE — Progress Notes (Signed)
Subjective: Patient reports Lower abdominal pain and constipation however overall feeling better  Objective: Vital signs in last 24 hours: Temp:  [98.2 F (36.8 C)-100.4 F (38 C)] 98.6 F (37 C) (07/14 0729) Pulse Rate:  [71-91] 71  (07/14 0300) Resp:  [10-22] 18  (07/14 0300) BP: (90-109)/(31-72) 106/31 mmHg (07/14 0300) SpO2:  [94 %-100 %] 100 % (07/14 0300)  Intake/Output from previous day: 07/13 0701 - 07/14 0700 In: 1605 [P.O.:480; I.V.:700; Blood:425] Out: 720 [Urine:700; Drains:20] Intake/Output this shift: Total I/O In: -  Out: 60 [Drains:60]  Dressing clean and dry drain intact. Motor function intact in lower extremity  Lab Results:  Basename 12/09/11 0800  WBC 13.7*  HGB 6.7*  HCT 20.1*  PLT 135*   BMET No results found for this basename: NA:2,K:2,CL:2,CO2:2,GLUCOSE:2,BUN:2,CREATININE:2,CALCIUM:2 in the last 72 hours  Studies/Results: No results found.  Assessment/Plan: Stable postop better status post transfusion no posttransfusion hematocrit checked as clinically patient is improved  LOS: 4 days  Transfer to floor, lactulose and or suppository for constipation   Loribeth Katich J 12/10/2011, 7:57 AM

## 2011-12-11 DIAGNOSIS — M7989 Other specified soft tissue disorders: Secondary | ICD-10-CM

## 2011-12-11 LAB — CBC WITH DIFFERENTIAL/PLATELET
Eosinophils Absolute: 0.2 10*3/uL (ref 0.0–0.7)
HCT: 28.7 % — ABNORMAL LOW (ref 36.0–46.0)
Hemoglobin: 9.6 g/dL — ABNORMAL LOW (ref 12.0–15.0)
Lymphs Abs: 0.5 10*3/uL — ABNORMAL LOW (ref 0.7–4.0)
MCH: 31 pg (ref 26.0–34.0)
Monocytes Relative: 12 % (ref 3–12)
Neutro Abs: 9.5 10*3/uL — ABNORMAL HIGH (ref 1.7–7.7)
Neutrophils Relative %: 81 % — ABNORMAL HIGH (ref 43–77)
RBC: 3.1 MIL/uL — ABNORMAL LOW (ref 3.87–5.11)

## 2011-12-11 MED ORDER — METHYLPREDNISOLONE 4 MG PO KIT
4.0000 mg | PACK | Freq: Four times a day (QID) | ORAL | Status: DC
  Administered 2011-12-13 (×2): 4 mg via ORAL

## 2011-12-11 MED ORDER — METHYLPREDNISOLONE 4 MG PO KIT
4.0000 mg | PACK | Freq: Three times a day (TID) | ORAL | Status: AC
  Administered 2011-12-12 (×3): 4 mg via ORAL

## 2011-12-11 MED ORDER — ONDANSETRON 4 MG PO TBDP
ORAL_TABLET | ORAL | Status: AC
  Administered 2011-12-11: 4 mg
  Filled 2011-12-11: qty 1

## 2011-12-11 MED ORDER — BISACODYL 5 MG PO TBEC
5.0000 mg | DELAYED_RELEASE_TABLET | Freq: Every day | ORAL | Status: DC | PRN
  Administered 2011-12-11: 5 mg via ORAL
  Filled 2011-12-11: qty 1

## 2011-12-11 MED ORDER — METHYLPREDNISOLONE 4 MG PO KIT
8.0000 mg | PACK | Freq: Every morning | ORAL | Status: DC
  Filled 2011-12-11: qty 21

## 2011-12-11 MED ORDER — METHYLPREDNISOLONE 4 MG PO KIT
8.0000 mg | PACK | Freq: Every evening | ORAL | Status: AC
  Administered 2011-12-11: 8 mg via ORAL

## 2011-12-11 MED ORDER — METHYLPREDNISOLONE 4 MG PO KIT
8.0000 mg | PACK | Freq: Every evening | ORAL | Status: AC
  Administered 2011-12-12: 8 mg via ORAL

## 2011-12-11 MED ORDER — METHYLPREDNISOLONE 4 MG PO KIT
4.0000 mg | PACK | ORAL | Status: AC
  Administered 2011-12-11: 4 mg via ORAL

## 2011-12-11 MED ORDER — PANTOPRAZOLE SODIUM 40 MG PO TBEC
40.0000 mg | DELAYED_RELEASE_TABLET | Freq: Every day | ORAL | Status: DC
  Administered 2011-12-11 – 2011-12-13 (×3): 40 mg via ORAL
  Filled 2011-12-11 (×2): qty 1

## 2011-12-11 MED ORDER — METHYLPREDNISOLONE 4 MG PO KIT
4.0000 mg | PACK | ORAL | Status: AC
  Administered 2011-12-11: 4 mg via ORAL
  Filled 2011-12-11: qty 21

## 2011-12-11 MED ORDER — FUROSEMIDE 10 MG/ML IJ SOLN
20.0000 mg | Freq: Once | INTRAMUSCULAR | Status: AC
  Administered 2011-12-11: 20 mg via INTRAVENOUS
  Filled 2011-12-11: qty 2

## 2011-12-11 NOTE — Progress Notes (Signed)
Subjective: Patient reports She's feeling better pain is under good control or least better control she is passing gas is experiencing significant swelling her lower extremities  Objective: Vital signs in last 24 hours: Temp:  [98 F (36.7 C)-100.3 F (37.9 C)] 98.3 F (36.8 C) (07/15 1051) Pulse Rate:  [76-87] 79  (07/15 1051) Resp:  [18-20] 20  (07/15 1051) BP: (97-113)/(42-58) 113/46 mmHg (07/15 1051) SpO2:  [94 %-99 %] 99 % (07/15 1051)  Intake/Output from previous day: 07/14 0701 - 07/15 0700 In: 1893 [P.O.:840; I.V.:1003; IV Piggyback:50] Out: 60 [Drains:60] Intake/Output this shift: Total I/O In: 120 [P.O.:120] Out: -   Strength is 5 out of 5 wound is clean and dry drain output continues to drop down to about 60 cc a plan on continuing this for the time being  Lab Results:  Basename 12/11/11 0938 12/09/11 0800  WBC 11.7* 13.7*  HGB 9.6* 6.7*  HCT 28.7* 20.1*  PLT 185 135*   BMET No results found for this basename: NA:2,K:2,CL:2,CO2:2,GLUCOSE:2,BUN:2,CREATININE:2,CALCIUM:2 in the last 72 hours  Studies/Results: No results found.  Assessment/Plan: Postop day 5 doing well although new looks to me edema will check a venous duplex talked Doppler to rule out DVT will continue her drain output is decreasing patient's parents we'll do numbness in her legs are minimally and start her on some steroids continue to work on pain management on the passenger 14 but she is having some episodes of hallucinations spanned being too strong medication for  LOS: 5 days     Waverley Krempasky P 12/11/2011, 11:37 AM

## 2011-12-11 NOTE — Progress Notes (Signed)
Occupational Therapy Treatment Patient Details Name: Donna French MRN: [id number] DOB: 1950-06-14 Today's Date: 12/11/2011 Time: 4098-1191 OT Time Calculation (min): 26 min  OT Assessment / Plan / Recommendation Comments on Treatment Session Pt continues to make progress, but was limited near end of session secondary to inc pain    Follow Up Recommendations  No OT follow up    Barriers to Discharge       Equipment Recommendations  Rolling walker with 5" wheels (Pt may need 3n1)    Recommendations for Other Services    Frequency     Plan Discharge plan remains appropriate    Precautions / Restrictions Precautions Precautions: Back Precaution Comments: able to verbalize 2/3 precautions. Re-educated on no twisting Required Braces or Orthoses: Spinal Brace Spinal Brace: Lumbar corset;Other (comment) Spinal Brace Comments: Pt already in brace while in bed. Educated pt to take off when sleeping, especially at night Restrictions Weight Bearing Restrictions: No   Pertinent Vitals/Pain Pt reported 3/10 back pain initially, but 6/10 back pain at end of session. RN informed.    ADL  Grooming: Performed;Wash/dry hands;Teeth care;Supervision/safety Where Assessed - Grooming: Supported standing (leans/ braces self with sink) Toilet Transfer: Performed;Min guard Statistician Method: Sit to Barista: Regular height toilet;Grab bars Toileting - Clothing Manipulation and Hygiene: Performed;Supervision/safety Where Assessed - Engineer, mining and Hygiene: Sit on 3-in-1 or toilet Tub/Shower Transfer: Simulated;Moderate assistance Tub/Shower Transfer Method: Ambulating (back in) Tub/Shower Transfer Equipment: Walk in shower Equipment Used: Rolling walker;Back brace;Gait belt ADL Comments: pt's pain increased with activity- causing her to become very distractible. Pt educated on need for AE for wiping, but unable to demonstrate as pt unable to attend  to therapist at end of session. Will need toileting aid/tong education. Also, will need further shower transfer education/performance    OT Diagnosis:    OT Problem List:   OT Treatment Interventions:     OT Goals ADL Goals ADL Goal: Grooming - Progress: Progressing toward goals ADL Goal: Toilet Transfer - Progress: Progressing toward goals ADL Goal: Toileting - Clothing Manipulation - Progress: Progressing toward goals ADL Goal: Toileting - Hygiene - Progress: Progressing toward goals ADL Goal: Tub/Shower Transfer - Progress: Progressing toward goals Miscellaneous OT Goals OT Goal: Miscellaneous Goal #1 - Progress: Progressing toward goals  Visit Information  Last OT Received On: 12/11/11 Assistance Needed: +1    Subjective Data      Prior Functioning       Cognition  Overall Cognitive Status: Appears within functional limits for tasks assessed/performed Arousal/Alertness: Awake/alert Orientation Level: Appears intact for tasks assessed Behavior During Session: [facility] for tasks performed    Mobility Bed Mobility Rolling Right: 4: Min guard Right Sidelying to Sit: 4: Min guard;HOB flat Sitting - Scoot to Edge of Bed: 5: Supervision Sit to Sidelying Right: 4: Min assist;HOB flat Details for Bed Mobility Assistance: VC for sequencings; assist to lift legs back into bed Transfers Sit to Stand: 4: Min guard;From bed;From toilet Stand to Sit: 5: Supervision;To bed;To toilet Details for Transfer Assistance: VC for hand placement as pt with tendency to place both on RW   Exercises    Balance    End of Session OT - End of Session Equipment Utilized During Treatment: Gait belt;Back brace Activity Tolerance: Patient limited by pain Patient left: in bed;with call bell/phone within reach;with family/visitor present Nurse Communication: Patient requests pain meds  GO     Camala Talwar 12/11/2011, 3:52 PM

## 2011-12-11 NOTE — Progress Notes (Signed)
Physical Therapy Treatment Patient Details Name: Donna French MRN: [id number] DOB: [birth year] Today's Date: 12/11/2011 Time: 4098-1191 PT Time Calculation (min): 30 min  PT Assessment / Plan / Recommendation Comments on Treatment Session  Pt moving better with less assistance; continues to require cues for back precautions and safe use of RW    Follow Up Recommendations  Home health PT;Supervision for mobility/OOB    Barriers to Discharge        Equipment Recommendations  Rolling walker with 5" wheels    Recommendations for Other Services    Frequency Min 5X/week   Plan Discharge plan remains appropriate;Frequency remains appropriate    Precautions / Restrictions Precautions Precautions: Back Precaution Comments: able to verbalize 2/3 precautions (forgot no arching, even with verbal and visual cues) Required Braces or Orthoses: Spinal Brace Spinal Brace: Lumbar corset;Other (comment) (already donned on arrival) Restrictions Weight Bearing Restrictions: No   Pertinent Vitals/Pain Back pain 5/10 on arrival; 4/10 after PT    Mobility  Bed Mobility Bed Mobility: Sit to Sidelying Right;Rolling Left Rolling Left: 5: Supervision Sit to Sidelying Right: 4: Min assist;HOB flat Details for Bed Mobility Assistance: vc for correct technique to maintain back precautions; assist to lift legs onto bed; vc for rolling to prevent twisting Transfers Transfers: Sit to Stand;Stand to Sit Sit to Stand: 4: Min guard;With upper extremity assist;From chair/3-in-1;From toilet Stand to Sit: 4: Min guard;With upper extremity assist;To bed;To toilet Details for Transfer Assistance: pt demonstrated proper hand placement; minguard to steady (especially as straight chair began to scoot backwards as she came to stand) Ambulation/Gait Ambulation/Gait Assistance: 4: Min guard Ambulation Distance (Feet): 140 Feet Assistive device: Rolling walker Ambulation/Gait Assistance Details: vc for proper/safe  use of RW (pt tends to push RW too far ahead--even with RW at lowest height, it is a bit too tall for her); vc to keep RW with her as she entered bathroom, as she approached sink, and again as she approached bed; vc for safe turning to prevent twisting Gait Pattern: Step-through pattern;Decreased stride length Stairs: No (verbally and visually demonstrated up/down 1 step/threshold)    Exercises     PT Diagnosis:    PT Problem List:   PT Treatment Interventions:     PT Goals Acute Rehab PT Goals Pt will Roll Supine to Left Side: with modified independence PT Goal: Rolling Supine to Left Side - Progress: Progressing toward goal Pt will go Sit to Supine/Side: with modified independence PT Goal: Sit to Supine/Side - Progress: Progressing toward goal Pt will go Sit to Stand: with modified independence PT Goal: Sit to Stand - Progress: Progressing toward goal Pt will go Stand to Sit: with modified independence PT Goal: Stand to Sit - Progress: Progressing toward goal Pt will Ambulate: >150 feet;with supervision;with least restrictive assistive device PT Goal: Ambulate - Progress: Progressing toward goal Pt will Go Up / Down Stairs: 6-9 stairs;with supervision;with rail(s) PT Goal: Up/Down Stairs - Progress: Discontinued (comment) (pt reports she can stay on main level)  Visit Information  Last PT Received On: 12/11/11 Assistance Needed: +1    Subjective Data  Subjective: I'm doing better Patient Stated Goal: To decr pain and protect her back   Cognition  Overall Cognitive Status: Appears within functional limits for tasks assessed/performed Arousal/Alertness: Awake/alert Orientation Level: Appears intact for tasks assessed Behavior During Session: [facility] for tasks performed    Balance     End of Session PT - End of Session Equipment Utilized During Treatment: Gait belt;Back brace Activity  Tolerance: Patient limited by fatigue Patient left: in bed;with call bell/phone within  reach;with family/visitor present Nurse Communication: Mobility status   GP     Donna French 12/11/2011, 11:50 AM Pager [phone]

## 2011-12-11 NOTE — Progress Notes (Signed)
VASCULAR LAB PRELIMINARY  PRELIMINARY  PRELIMINARY  PRELIMINARY  Bilateral lower extremity venous duplex completed.    Preliminary report:  Bilateral:  No evidence of DVT, superficial thrombosis, or Baker's Cyst. Mild to moderate edema noted throughout bilaterally.  Miklos Bidinger, RVS 12/11/2011, 1:10 PM

## 2011-12-12 LAB — BASIC METABOLIC PANEL
BUN: 5 mg/dL — ABNORMAL LOW (ref 6–23)
Chloride: 101 mEq/L (ref 96–112)
GFR calc non Af Amer: >90 mL/min (ref >90)
Glucose, Bld: 128 mg/dL — ABNORMAL HIGH (ref 70–99)
Potassium: 4.3 mEq/L (ref 3.5–5.1)

## 2011-12-12 MED ORDER — POLYETHYLENE GLYCOL 3350 17 G PO PACK
17.0000 g | PACK | Freq: Every day | ORAL | Status: DC
  Administered 2011-12-12 – 2011-12-13 (×2): 17 g via ORAL
  Filled 2011-12-12 (×2): qty 1

## 2011-12-12 NOTE — Care Management Note (Signed)
    Page 1 of 1   12/14/2011     10:04:37 AM   CARE MANAGEMENT NOTE 12/14/2011  Patient:  Donna French, Donna French   Account Number:  [id number]  Date Initiated:  12/12/2011  Documentation initiated by:  Onnie Boer  Subjective/Objective Assessment:   PT WAS ADMITTED FOR BACK SURGERY     Action/Plan:   PROGRESSION OF CARE AND DISCHARGE PLANNING   Anticipated DC Date:  12/13/2011   Anticipated DC Plan:  HOME W HOME HEALTH SERVICES      DC Planning Services  CM consult      Choice offered to / List presented to:  C-1 Patient           Status of service:  Completed, signed off Medicare Important Message given?   (If response is "NO", the following Medicare IM given date fields will be blank) Date Medicare IM given:   Date Additional Medicare IM given:    Discharge Disposition:  HOME/SELF CARE  Per UR Regulation:  Reviewed for med. necessity/level of care/duration of stay  If discussed at Long Length of Stay Meetings, dates discussed:   12/12/2011    Comments:  12/12/11 Onnie Boer, RN, BSN 1452 PT WAS ADMITTED FOR SURGERY.  PT IS NOW HAVING SOME BP ISSUES AND SONTS WITH HER HEMOVAC INPLACE.  PT CONTS TO WORK WITH PT AND SHOULD DC TO HOME TOMORROW IF MEDICALLY STABLE WITH HH PT AND A RW.

## 2011-12-12 NOTE — Progress Notes (Signed)
12/12/2011 Fredrich Birks PTA [phone] pager [phone] office

## 2011-12-12 NOTE — Progress Notes (Signed)
Pt's BP-81/38 P-78, pt. denies any  pain or discomfort at this time. Called Dr. Jordan Likes (MD on call) and no orders was given. Will continue to monitor.

## 2011-12-12 NOTE — Progress Notes (Signed)
Physical Therapy Treatment Patient Details Name: Donna French MRN: [id number] DOB: 1951-03-27 Today's Date: 12/12/2011 Time: 3086-5784 PT Time Calculation (min): 25 min  PT Assessment / Plan / Recommendation Comments on Treatment Session  Pt was able to increase ambulation distance today and to incorporate stairs.  Pt felt more comfortable using sideways pattern on steps due to guarding when stair climbing.      Follow Up Recommendations  Home health PT;Supervision for mobility/OOB    Barriers to Discharge        Equipment Recommendations       Recommendations for Other Services    Frequency Min 5X/week   Plan Discharge plan remains appropriate;Frequency remains appropriate    Precautions / Restrictions Precautions Precautions: Back Precaution Comments: Able to verbalize 3/3 precautions. Required Braces or Orthoses: Spinal Brace Spinal Brace: Applied in sitting position   Pertinent Vitals/Pain 2/10 back pain    Mobility  Bed Mobility Bed Mobility: Not assessed Transfers Stand to Sit: 5: Supervision;With upper extremity assist;With armrests Ambulation/Gait Ambulation/Gait Assistance: 5: Supervision Ambulation Distance (Feet): 1300 Feet Assistive device: Rolling walker Ambulation/Gait Assistance Details: VC to use RW correctly, not to hold on to side and walk away.   Gait Pattern: Step-through pattern;Decreased stride length General Gait Details: decreased Stairs: Yes Stairs Assistance: 4: Min guard Stair Management Technique: One rail Right;Step to pattern;Sideways Number of Stairs: 10     Exercises     PT Diagnosis:    PT Problem List:   PT Treatment Interventions:     PT Goals Acute Rehab PT Goals PT Goal: Stand to Sit - Progress: Progressing toward goal PT Goal: Ambulate - Progress: Progressing toward goal PT Goal: Up/Down Stairs - Progress: Progressing toward goal (pt able to practice stairs (sideways/1 rail))  Visit Information  Last PT Received On:  12/12/11 Assistance Needed: +1    Subjective Data      Cognition  Overall Cognitive Status: Appears within functional limits for tasks assessed/performed Arousal/Alertness: Awake/alert Orientation Level: Appears intact for tasks assessed Behavior During Session: [facility] for tasks performed    Balance  Balance Balance Assessed: Yes Dynamic Standing Balance Dynamic Standing - Balance Support: No upper extremity supported;During functional activity Dynamic Standing - Level of Assistance: 5: Stand by assistance Dynamic Standing - Comments: Pt stood at sink for functional activities,  occaisionally pt reached for wall to steady herself.  End of Session PT - End of Session Equipment Utilized During Treatment: Gait belt;Back brace Activity Tolerance: Patient tolerated treatment well Patient left: in chair;with call bell/phone within reach Nurse Communication: Mobility status   GP     Jasilyn Holderman, SPTA 12/12/2011, 8:34 AM

## 2011-12-12 NOTE — Progress Notes (Signed)
Subjective: Patient reports Patient is doing well no leg pain back pain is getting better control  Objective: Vital signs in last 24 hours: Temp:  [98 F (36.7 C)-98.3 F (36.8 C)] 98 F (36.7 C) (07/16 0600) Pulse Rate:  [73-97] 79  (07/16 0600) Resp:  [16-20] 16  (07/16 0600) BP: (81-113)/(36-48) 90/48 mmHg (07/16 0702) SpO2:  [90 %-99 %] 92 % (07/16 0600)  Intake/Output from previous day: 07/15 0701 - 07/16 0700 In: 483 [P.O.:480; I.V.:3] Out: -  Intake/Output this shift:    Strength 5 out of 5 wound clean and dry  Lab Results:  Basename 12/11/11 0938 12/09/11 0800  WBC 11.7* 13.7*  HGB 9.6* 6.7*  HCT 28.7* 20.1*  PLT 185 135*   BMET  Basename 12/12/11 0620  NA 137  K 4.3  CL 101  CO2 30  GLUCOSE 128*  BUN 5*  CREATININE 0.57  CALCIUM 8.0*    Studies/Results: No results found.  Assessment/Plan: Do much better today legs are much less swollen we'll DC her fentanyl patch and try to maintain her on oral Dilaudid aggressive mobilization continue her condyles  LOS: 6 days     Indy Kuck P 12/12/2011, 7:38 AM

## 2011-12-13 LAB — POCT I-STAT 4, (NA,K, GLUC, HGB,HCT)
Glucose, Bld: 127 mg/dL — ABNORMAL HIGH (ref 70–99)
HCT: 30 % — ABNORMAL LOW (ref 36.0–46.0)
Hemoglobin: 10.2 g/dL — ABNORMAL LOW (ref 12.0–15.0)
Potassium: 3.7 meq/L (ref 3.5–5.1)
Sodium: 139 meq/L (ref 135–145)

## 2011-12-13 MED ORDER — HYDROMORPHONE HCL 4 MG PO TABS: 4.0000 mg | ORAL_TABLET | ORAL | Status: AC | PRN

## 2011-12-13 MED ORDER — BISACODYL 10 MG RE SUPP: 10.0000 mg | Freq: Every day | RECTAL | Status: AC | PRN

## 2011-12-13 MED ORDER — FUROSEMIDE 10 MG/ML PO SOLN: 10.0000 mg | Freq: Once | ORAL | Status: DC

## 2011-12-13 MED ORDER — FUROSEMIDE 20 MG PO TABS
10.0000 mg | ORAL_TABLET | Freq: Once | ORAL | Status: AC
  Administered 2011-12-13: 10 mg via ORAL
  Filled 2011-12-13: qty 0.5

## 2011-12-13 MED ORDER — CYCLOBENZAPRINE HCL 10 MG PO TABS: 10.0000 mg | ORAL_TABLET | Freq: Three times a day (TID) | ORAL | Status: AC | PRN

## 2011-12-13 NOTE — Progress Notes (Signed)
Subjective: Patient reports She's doing well pain is well controlled no leg pain just the swelling in her legs  Objective: Vital signs in last 24 hours: Temp:  [97.7 F (36.5 C)-98.5 F (36.9 C)] 98 F (36.7 C) (07/17 0200) Pulse Rate:  [70-83] 70  (07/17 0200) Resp:  [16-18] 18  (07/17 0200) BP: (94-130)/(40-56) 106/56 mmHg (07/17 0200) SpO2:  [92 %-99 %] 99 % (07/17 0200)  Intake/Output from previous day: 07/16 0701 - 07/17 0700 In: 723 [P.O.:720; I.V.:3] Out: -  Intake/Output this shift:    Strength out of 5 significant lower 70s swelling  Lab Results:  Basename 12/11/11 0938  WBC 11.7*  HGB 9.6*  HCT 28.7*  PLT 185   BMET  Basename 12/12/11 0620  NA 137  K 4.3  CL 101  CO2 30  GLUCOSE 128*  BUN 5*  CREATININE 0.57  CALCIUM 8.0*    Studies/Results: No results found.  Assessment/Plan: Will give an additional dose of Lasix this point our diuresis of fluid and decreased swelling in her legs will ambulate plan discharge later today  LOS: 7 days     Donna French P 12/13/2011, 8:37 AM

## 2011-12-13 NOTE — Progress Notes (Signed)
Occupational Therapy Treatment Patient Details Name: Donna French MRN: [id number] DOB: Aug 30, 1950 Today's Date: 12/13/2011 Time: 4098-1191 OT Time Calculation (min): 25 min  OT Assessment / Plan / Recommendation Comments on Treatment Session Pt progressing very well    Follow Up Recommendations  No OT follow up    Barriers to Discharge       Equipment Recommendations  Rolling walker with 5" wheels    Recommendations for Other Services    Frequency     Plan Discharge plan remains appropriate    Precautions / Restrictions Precautions Precautions: Fall Precaution Comments: able to verbalize 2/3 precautions; VC required to recall "twisting" Required Braces or Orthoses: Spinal Brace Spinal Brace:  (previously applied) Restrictions Weight Bearing Restrictions: No   Pertinent Vitals/Pain Pt reports 3/10 back pain during session; pt premedicated. Pt with MAP of 56 at beginning of session but asymptomatic and agreeable to up with therapy.    ADL  Eating/Feeding: Performed;Independent Where Assessed - Eating/Feeding: Chair Grooming: Performed;Wash/dry hands;Supervision/safety Where Assessed - Grooming: Unsupported standing Lower Body Dressing: Simulated;Moderate assistance Where Assessed - Lower Body Dressing: Supported sitting Toilet Transfer: Performed;Modified independent Toilet Transfer Method: Sit to Barista: Regular height toilet;Grab bars Toileting - Clothing Manipulation and Hygiene: Performed;Modified independent Where Assessed - Toileting Clothing Manipulation and Hygiene: Standing (steadying self with grab bar) Tub/Shower Transfer: Performed;Min guard Tub/Shower Transfer Method: Science writer: Walk in shower Equipment Used: Back brace;Gait belt Transfers/Ambulation Related to ADLs: pt Min guard A with short distance ambulation with no AD ADL Comments: Educated pt and husband on use of AE (esp reacher and toileting  aid) for bathing and dressing. Also, educated pt and husband  possible places to purchase these items. Educated pt on UB dsg with shirt first then brace. Also, on brace wearing schedule (pt able to repeat back I'ly)    OT Diagnosis:    OT Problem List:   OT Treatment Interventions:     OT Goals ADL Goals ADL Goal: Grooming - Progress: Progressing toward goals ADL Goal: Lower Body Dressing - Progress: Progressing toward goals ADL Goal: Toilet Transfer - Progress: Progressing toward goals ADL Goal: Toileting - Clothing Manipulation - Progress: Met ADL Goal: Toileting - Hygiene - Progress: Met Pt Will Perform Tub/Shower Transfer: Shower transfer;Ambulation;with supervision ADL Goal: Web designer - Progress: Met Miscellaneous OT Goals OT Goal: Miscellaneous Goal #1 - Progress: Progressing toward goals  Visit Information  Last OT Received On: 12/13/11 Assistance Needed: +1    Subjective Data      Prior Functioning       Cognition  Overall Cognitive Status: Appears within functional limits for tasks assessed/performed    Mobility Transfers Sit to Stand: 6: Modified independent (Device/Increase time) Stand to Sit: 6: Modified independent (Device/Increase time) Details for Transfer Assistance: Pt used correct form and observed precautions during sit/stand transfers   Exercises    Balance    End of Session OT - End of Session Equipment Utilized During Treatment: Gait belt;Back brace Activity Tolerance: Patient tolerated treatment well Patient left: in chair;with call bell/phone within reach;with family/visitor present  GO     Alfred Eckley 12/13/2011, 3:38 PM

## 2011-12-13 NOTE — Progress Notes (Signed)
Physical Therapy Treatment Patient Details Name: Donna French MRN: [id number] DOB: 01-06-1951 Today's Date: 12/13/2011 Time: 4098-1191 PT Time Calculation (min): 16 min  PT Assessment / Plan / Recommendation Comments on Treatment Session  Pt was moving well today and did well without RW.  Educated pt to take RW if she will be going long distances, may be fatigued, or feels it will be helpful for safety.  Pt practiced stairs facing forward (2 rails) and sideways (1 rail).       Follow Up Recommendations  Home health PT;Supervision for mobility/OOB    Barriers to Discharge        Equipment Recommendations       Recommendations for Other Services    Frequency Min 5X/week   Plan Discharge plan remains appropriate;Frequency remains appropriate    Precautions / Restrictions Precautions Precautions: Fall Precaution Comments: able to verbalize 3/3 precautions Required Braces or Orthoses: Spinal Brace Spinal Brace: Lumbar corset;Applied in sitting position Spinal Brace Comments: Pt had put brace on correctly prior to session   Pertinent Vitals/Pain 2/10 back pain    Mobility  Bed Mobility Bed Mobility: Not assessed Transfers Sit to Stand: 6: Modified independent (Device/Increase time) Stand to Sit: 6: Modified independent (Device/Increase time) Details for Transfer Assistance: Pt used correct form and observed precautions during sit/stand transfers Ambulation/Gait Ambulation/Gait Assistance: 5: Supervision Assistive device: None Ambulation/Gait Assistance Details: Pt was able to walk without RW, supervision for safety Gait Pattern: Step-through pattern;Decreased stride length General Gait Details: decreased gait speed Stairs: Yes Stairs Assistance: 5: Supervision Stairs Assistance Details (indicate cue type and reason): Supervision for safety Stair Management Technique: Two rails;Sideways;Forwards;Step to pattern;One rail Right Number of Stairs: 10     Exercises     PT  Diagnosis:    PT Problem List:   PT Treatment Interventions:     PT Goals    Visit Information  Last PT Received On: 12/13/11 Assistance Needed: +1    Subjective Data      Cognition  Overall Cognitive Status: Appears within functional limits for tasks assessed/performed Arousal/Alertness: Awake/alert Orientation Level: Appears intact for tasks assessed Behavior During Session: [facility] for tasks performed    Balance     End of Session PT - End of Session Equipment Utilized During Treatment: Gait belt Activity Tolerance: Patient tolerated treatment well Patient left: in chair;with call bell/phone within reach Nurse Communication: Mobility status   GP     Donna French 12/13/2011, 10:10 AM

## 2011-12-13 NOTE — Discharge Summary (Signed)
  Physician Discharge Summary  Patient ID: Donna French MRN: [id number] DOB/AGE: March 09, 1951 61 y.o.  Admit date: 12/06/2011 Discharge date: 12/13/2011  Admission Diagnoses: Degenerative scoliosis and lumbar spinal stenosis T10-S1 Discharge Diagnoses: Same Active Problems:  * No active hospital problems. *    Discharged Condition: good  Hospital Course: Patient was admitted to the hospital underwent a L2-S1 decompression and fusion and a T10-S1 fusion for degenerative scoliosis of lumbar spinal stenosis postoperatively patient did very well with recovered in the ICU was observed in the ICU for a proximal 40-72 hours patient initially had a lateral pain control however this improved over the first several days she had postoperative ileus in addition but eventually started passing gas by postop day 45 she is progressively mobilize with physical outpatient therapy and by postoperative day 7 the patient was stable and be discharged home scheduled followup approximately one week. Problems or and time at the time of discharge she still has some lotion be swelling is easily controlled with diuretics although they do tend to drop her blood pressure low but but it was significantly improved the time of discharge. Her pain was being controlled on oral Dilaudid as well as cyclobenzaprine she was having bowel movements and passing gas and urinating spontaneously she denied any leg pain back pain was well-controlled.  Consults: Significant Diagnostic Studies: Treatments: T10-S1 fusion L2-S1 decompression Discharge Exam: Blood pressure 109/42, pulse 70, temperature 98.1 F (36.7 C), temperature source Oral, resp. rate 18, height 4\' 11"  (1.499 m), weight 57.3 kg (126 lb 5.2 oz), SpO2 98.00%. Strength out of 5 wound clean and dry  Disposition: Home   Medication List  As of 12/13/2011  4:33 PM   TAKE these medications         bisacodyl 10 MG suppository   Commonly known as: DULCOLAX   Place 1  suppository (10 mg total) rectally daily as needed.      cyclobenzaprine 10 MG tablet   Commonly known as: FLEXERIL   Take 1 tablet (10 mg total) by mouth 3 (three) times daily as needed for muscle spasms.      HYDROcodone-acetaminophen 5-500 MG per tablet   Commonly known as: VICODIN   Take 1 tablet by mouth every 6 (six) hours as needed.      HYDROmorphone 4 MG tablet   Commonly known as: DILAUDID   Take 1 tablet (4 mg total) by mouth every 4 (four) hours as needed.      levothyroxine 88 MCG tablet   Commonly known as: SYNTHROID, LEVOTHROID   Take 88 mcg by mouth daily.      sertraline 100 MG tablet   Commonly known as: ZOLOFT   Take 100 mg by mouth daily.             Signed: Obrien Huskins P 12/13/2011, 4:33 PM

## 2011-12-13 NOTE — Progress Notes (Signed)
D/C education and care plan teaching completed with pt. And pt's husband. No unanswered questions noted. IV to left F/A d/c'd by pt.,stating,"I'm so ready to go home that I went ahead and took it out myself." Pt. D/C'd to home with husband.

## 2011-12-13 NOTE — Progress Notes (Signed)
Seen and agreed 12/13/2011 Fredrich Birks PTA [phone] pager [phone] office

## 2012-06-21 ENCOUNTER — Other Ambulatory Visit: Payer: Self-pay | Admitting: Neurosurgery

## 2012-06-21 DIAGNOSIS — IMO0002 Reserved for concepts with insufficient information to code with codable children: Secondary | ICD-10-CM

## 2012-06-21 DIAGNOSIS — M51379 Other intervertebral disc degeneration, lumbosacral region without mention of lumbar back pain or lower extremity pain: Secondary | ICD-10-CM

## 2012-06-21 DIAGNOSIS — M4804 Spinal stenosis, thoracic region: Secondary | ICD-10-CM

## 2012-06-21 DIAGNOSIS — M5137 Other intervertebral disc degeneration, lumbosacral region: Secondary | ICD-10-CM

## 2012-06-21 DIAGNOSIS — M47817 Spondylosis without myelopathy or radiculopathy, lumbosacral region: Secondary | ICD-10-CM

## 2012-06-24 ENCOUNTER — Ambulatory Visit
Admission: RE | Admit: 2012-06-24 | Discharge: 2012-06-24 | Disposition: A | Discharge status: Home / Self Care | Payer: BC Managed Care – PPO | Source: Ambulatory Visit | Attending: Neurosurgery | Admitting: Neurosurgery

## 2012-06-24 DIAGNOSIS — M4804 Spinal stenosis, thoracic region: Secondary | ICD-10-CM

## 2012-06-24 DIAGNOSIS — M47817 Spondylosis without myelopathy or radiculopathy, lumbosacral region: Secondary | ICD-10-CM

## 2012-06-24 DIAGNOSIS — IMO0002 Reserved for concepts with insufficient information to code with codable children: Secondary | ICD-10-CM

## 2012-06-24 DIAGNOSIS — M5137 Other intervertebral disc degeneration, lumbosacral region: Secondary | ICD-10-CM

## 2012-07-12 ENCOUNTER — Institutional Professional Consult (permissible substitution): Payer: BC Managed Care – PPO | Admitting: Internal Medicine

## 2012-08-01 ENCOUNTER — Ambulatory Visit (INDEPENDENT_AMBULATORY_CARE_PROVIDER_SITE_OTHER): Payer: BC Managed Care – PPO | Admitting: Internal Medicine

## 2012-08-01 ENCOUNTER — Ambulatory Visit (INDEPENDENT_AMBULATORY_CARE_PROVIDER_SITE_OTHER)
Admission: RE | Admit: 2012-08-01 | Discharge: 2012-08-01 | Disposition: A | Discharge status: Home / Self Care | Payer: BC Managed Care – PPO | Source: Ambulatory Visit | Attending: Internal Medicine | Admitting: Internal Medicine

## 2012-08-01 ENCOUNTER — Encounter: Payer: Self-pay | Admitting: Internal Medicine

## 2012-08-01 VITALS — BP 114/78 | HR 74 | Temp 97.6°F | Ht 60.0 in | Wt 123.4 lb

## 2012-08-01 DIAGNOSIS — J441 Chronic obstructive pulmonary disease with (acute) exacerbation: Secondary | ICD-10-CM

## 2012-08-01 DIAGNOSIS — R911 Solitary pulmonary nodule: Secondary | ICD-10-CM

## 2012-08-01 DIAGNOSIS — R918 Other nonspecific abnormal finding of lung field: Secondary | ICD-10-CM | POA: Insufficient documentation

## 2012-08-01 DIAGNOSIS — J45909 Unspecified asthma, uncomplicated: Secondary | ICD-10-CM | POA: Insufficient documentation

## 2012-08-01 DIAGNOSIS — R05 Cough: Secondary | ICD-10-CM

## 2012-08-01 DIAGNOSIS — F17200 Nicotine dependence, unspecified, uncomplicated: Secondary | ICD-10-CM | POA: Insufficient documentation

## 2012-08-01 MED ORDER — VARENICLINE TARTRATE 0.5 MG X 11 & 1 MG X 42 PO MISC: ORAL | Status: DC

## 2012-08-01 NOTE — Progress Notes (Addendum)
  Subjective:    Patient ID: Donna French, female    DOB: Jun 23, 1950  MRN: [id number]  HPI  49 yowf smoker with chronic cough starting in her 64's referred by Dr Wynetta Emery for abn ct T spine with ? Pulmonary nodules.    08/01/2012 1st pulmonary eval cc chronic cough some worse x 6 months assoc with sense of pnds worse at hs and in am with mostly mucoid sputum.  No obvious daytime variabilty or assoc sob or cp or chest tightness, subjective wheeze overt sinus or hb symptoms. No unusual exp hx or h/o childhood pna/ asthma or premature birth to her knowledge.    Also denies any obvious fluctuation of symptoms with weather or environmental changes or other aggravating or alleviating factors except as outlined above    Review of Systems  Constitutional: Negative for fever, chills and unexpected weight change.  HENT: Positive for congestion. Negative for ear pain, nosebleeds, sore throat, rhinorrhea, sneezing, trouble swallowing, dental problem, voice change, postnasal drip and sinus pressure.   Eyes: Negative for visual disturbance.  Respiratory: Positive for cough and shortness of breath. Negative for choking.   Cardiovascular: Negative for chest pain and leg swelling.  Gastrointestinal: Negative for vomiting, abdominal pain and diarrhea.  Genitourinary: Negative for difficulty urinating.  Musculoskeletal: Negative for arthralgias.  Skin: Negative for rash.  Neurological: Negative for tremors, syncope and headaches.  Hematological: Does not bruise/bleed easily.       Objective:   Physical Exam  Very pleasant amb wf nad  Wt Readings from Last 3 Encounters:  08/01/12 123 lb 6.4 oz (55.974 kg)  12/06/11 126 lb 5.2 oz (57.3 kg)  12/06/11 126 lb 5.2 oz (57.3 kg)   HEENT mild turbinate edema.  Oropharynx no thrush or excess pnd or cobblestoning.  No JVD or cervical adenopathy. Mild accessory muscle hypertrophy. Trachea midline, nl thryroid. Chest was hyperinflated by percussion with diminished  breath sounds and moderate increased exp time without wheeze. Hoover sign positive at mid inspiration. Regular rate and rhythm without murmur gallop or rub or increase P2 or edema.  Abd: no hsm, nl excursion. Ext warm without cyanosis or clubbing.    CXR  08/01/2012 :   COPD without acute finding.        Assessment & Plan:

## 2012-08-01 NOTE — Patient Instructions (Addendum)
Zyrtec is best option for drainage, try taking one at bedtime as needed  Please remember to go to the xray department downstairs for your tests - we will call you with the results when they are available.  The key is to stop smoking completely before smoking completely stops you!   Chantix starter pack > start 7 days prior to your quit date but you will need to change your routine  Please schedule a follow up visit in 3 months but call sooner if needed for PFT's

## 2012-08-01 NOTE — Assessment & Plan Note (Signed)
I took an extended  opportunity with this patient to outline the consequences of continued cigarette use  in airway disorders based on all the data we have from the multiple national lung health studies (perfomed over decades at millions of dollars in cost)  indicating that smoking cessation, not choice of inhalers or physicians, is the most important aspect of care.    Agreed to consider chantix

## 2012-08-01 NOTE — Assessment & Plan Note (Signed)
Although there are clearly abnormalities on CT scan, they should probably be considered "microscopic" since not obvious on plain cxr .     In the setting of obvious "macroscopic" health issues,  I am very reluctatnt to embark on an invasive w/u at this point but will arrange consevative  follow up and in the meantime see what we can do to address the patient's subjective concerns.    Radiology not really concerned about the nodules but since we don't have a ct chest and plain cxr is nl rec baseline ct chest in 6 months  Discussed in detail all the  indications, usual  risks and alternatives  relative to the benefits with patient who agrees to proceed with conservative f/u

## 2012-08-01 NOTE — Assessment & Plan Note (Addendum)
Baseline pft's ordered  I reviewed the Flethcher curve with patient that basically indicates  if you quit smoking when your best day FEV1 is still well preserved (which hers appears to be)  it is highly unlikely you will progress to severe disease and informed the patient there was no medication on the market that has proven to change the curve or the likelihood of progression.  Therefore stopping smoking and maintaining abstinence is the most important aspect of care, not choice of inhalers or for that matter, doctors.    Did not commit to quit but thinking about it > discussed separately

## 2012-08-01 NOTE — Assessment & Plan Note (Signed)
  Most likely this is  Classic Upper airway cough syndrome, so named because it's frequently impossible to sort out how much is  CR/sinusitis with freq throat clearing (which can be related to primary GERD)   vs  causing  secondary (" extra esophageal")  GERD from wide swings in gastric pressure that occur with throat clearing, often  promoting self use of mint and menthol lozenges that reduce the lower esophageal sphincter tone and exacerbate the problem further in a cyclical fashion.   These are the same pts (now being labeled as having "irritable larynx syndrome" by some cough centers) who not infrequently have a history of having failed to tolerate ace inhibitors,  dry powder inhalers or biphosphonates or report having atypical reflux symptoms that don't respond to standard doses of PPI , and are easily confused as having aecopd or asthma flares by even experienced allergists/ pulmonologists.   - Trial of zyrtec rec

## 2012-08-05 ENCOUNTER — Other Ambulatory Visit: Payer: Self-pay | Admitting: Internal Medicine

## 2012-08-05 MED ORDER — ALBUTEROL SULFATE HFA 108 (90 BASE) MCG/ACT IN AERS: 2.0000 | INHALATION_SPRAY | Freq: Four times a day (QID) | RESPIRATORY_TRACT | Status: DC | PRN

## 2012-08-05 NOTE — Progress Notes (Signed)
Quick Note:  Spoke with pt and notified of results per Dr. Wert. Pt verbalized understanding and denied any questions.  ______ 

## 2012-10-16 ENCOUNTER — Other Ambulatory Visit: Payer: Self-pay | Admitting: Neurosurgery

## 2012-10-16 DIAGNOSIS — M5137 Other intervertebral disc degeneration, lumbosacral region: Secondary | ICD-10-CM

## 2012-10-16 DIAGNOSIS — IMO0002 Reserved for concepts with insufficient information to code with codable children: Secondary | ICD-10-CM

## 2012-10-16 DIAGNOSIS — M48061 Spinal stenosis, lumbar region without neurogenic claudication: Secondary | ICD-10-CM

## 2012-10-17 ENCOUNTER — Ambulatory Visit
Admission: RE | Admit: 2012-10-17 | Discharge: 2012-10-17 | Disposition: A | Discharge status: Home / Self Care | Payer: BC Managed Care – PPO | Source: Ambulatory Visit | Attending: Neurosurgery | Admitting: Neurosurgery

## 2012-10-17 DIAGNOSIS — M5137 Other intervertebral disc degeneration, lumbosacral region: Secondary | ICD-10-CM

## 2012-10-17 DIAGNOSIS — M48061 Spinal stenosis, lumbar region without neurogenic claudication: Secondary | ICD-10-CM

## 2012-10-17 DIAGNOSIS — IMO0002 Reserved for concepts with insufficient information to code with codable children: Secondary | ICD-10-CM

## 2012-10-29 ENCOUNTER — Other Ambulatory Visit: Payer: Self-pay | Admitting: Neurosurgery

## 2012-11-25 ENCOUNTER — Encounter (HOSPITAL_COMMUNITY): Payer: Self-pay | Admitting: Pharmacy Technician

## 2012-12-04 ENCOUNTER — Ambulatory Visit (INDEPENDENT_AMBULATORY_CARE_PROVIDER_SITE_OTHER): Payer: BC Managed Care – PPO | Admitting: Internal Medicine

## 2012-12-04 ENCOUNTER — Encounter (HOSPITAL_COMMUNITY): Payer: Self-pay

## 2012-12-04 ENCOUNTER — Encounter: Payer: Self-pay | Admitting: Internal Medicine

## 2012-12-04 ENCOUNTER — Encounter (HOSPITAL_COMMUNITY)
Admission: RE | Admit: 2012-12-04 | Discharge: 2012-12-04 | Disposition: A | Discharge status: Home / Self Care | Payer: BC Managed Care – PPO | Source: Ambulatory Visit | Attending: Neurosurgery | Admitting: Neurosurgery

## 2012-12-04 VITALS — BP 110/68 | HR 72 | Temp 97.6°F | Ht 59.5 in | Wt 117.0 lb

## 2012-12-04 DIAGNOSIS — J4520 Mild intermittent asthma, uncomplicated: Secondary | ICD-10-CM

## 2012-12-04 DIAGNOSIS — R918 Other nonspecific abnormal finding of lung field: Secondary | ICD-10-CM

## 2012-12-04 DIAGNOSIS — Z01812 Encounter for preprocedural laboratory examination: Secondary | ICD-10-CM | POA: Insufficient documentation

## 2012-12-04 DIAGNOSIS — J45909 Unspecified asthma, uncomplicated: Secondary | ICD-10-CM

## 2012-12-04 DIAGNOSIS — J449 Chronic obstructive pulmonary disease, unspecified: Secondary | ICD-10-CM

## 2012-12-04 HISTORY — DX: Unspecified osteoarthritis, unspecified site: M19.90

## 2012-12-04 HISTORY — DX: Pneumonia, unspecified organism: J18.9

## 2012-12-04 HISTORY — DX: Unspecified asthma, uncomplicated: J45.909

## 2012-12-04 LAB — CBC
HCT: 44.9 % (ref 36.0–46.0)
MCH: 30.6 pg (ref 26.0–34.0)
MCV: 93 fL (ref 78.0–100.0)
RBC: 4.83 MIL/uL (ref 3.87–5.11)
WBC: 8.3 10*3/uL (ref 4.0–10.5)

## 2012-12-04 LAB — PULMONARY FUNCTION TEST

## 2012-12-04 MED ORDER — BUDESONIDE-FORMOTEROL FUMARATE 160-4.5 MCG/ACT IN AERO: INHALATION_SPRAY | RESPIRATORY_TRACT | Status: DC

## 2012-12-04 NOTE — Assessment & Plan Note (Addendum)
-   PFT's 12/04/2012 wnl p B2, a 35 % improvement  - Symbicort 160  2bid start 12/04/2012    Clearly asthma, not copd  Needs to quit smoking completely and return in 6 weeks to regroup ? Step down rx   The proper method of use, as well as anticipated side effects, of a metered-dose inhaler are discussed and demonstrated to the patient. Improved effectiveness after extensive coaching during this visit to a level of approximately  90%

## 2012-12-04 NOTE — Progress Notes (Signed)
PFT done today. 

## 2012-12-04 NOTE — Pre-Procedure Instructions (Signed)
Donna French  12/04/2012   Your procedure is scheduled on:  Monday, December 09, 2012  Report to [facility] at 5:30 AM.  Call this number if you have problems the morning of surgery: [phone]   Remember:   Do not eat food or drink liquids after midnight.   Take these medicines the morning of surgery with A SIP OF WATER: levothyroxine (SYNTHROID, LEVOTHROID) 88 MCG tablet  budesonide-formoterol (SYMBICORT) 160-4.5 MCG/ACT inhaler, if needed:albuterol (PROAIR HFA) 108 (90 BASE) MCG/ACT inhaler Stop taking Aspirin and herbal medications. Do not take any NSAIDs ie: Ibuprofen, Advil, Naproxen or any medication containing Aspirin.  Do not wear jewelry, make-up or nail polish.  Do not wear lotions, powders, or perfumes. You may wear deodorant.  Do not shave 48 hours prior to surgery.   Do not bring valuables to the hospital.  [facility] is not responsible  for any belongings or valuables.  Contacts, dentures or bridgework may not be worn into surgery.  Leave suitcase in the car. After surgery it may be brought to your room.  For patients admitted to the hospital, checkout time is 11:00 AM the day of discharge.   Patients discharged the day of surgery will not be allowed to drive home.  Name and phone number of your driver:   Special Instructions: Shower using CHG 2 nights before surgery and the night before surgery.  If you shower the day of surgery use CHG.  Use special wash - you have one bottle of CHG for all showers.  You should use approximately 1/3 of the bottle for each shower.   Please read over the following fact sheets that you were given: Pain Booklet, Coughing and Deep Breathing, Blood Transfusion Information, MRSA Information and Surgical Site Infection Prevention

## 2012-12-04 NOTE — Progress Notes (Signed)
Pt denies SOB, chest pain, being under the care of a cardiologist and having any cardiac tests done (stress, echo, cardiac cath). EKG results were requested from Internal Medicine in [place] , [place] along with latest office notes. Pt advised to bring rescue inhaler on DOS (albuterol (PROAIR HFA) 108 (90 BASE) MCG/ACT inhaler) as needed for wheezing.

## 2012-12-04 NOTE — Progress Notes (Signed)
  Subjective:    Patient ID: Donna French, female    DOB: [birth year]  MRN: [id number]  HPI  9 yowf smoker with chronic cough starting in her 72's referred by Dr Wynetta Emery for abn ct T spine with ? Pulmonary nodules.    08/01/2012 1st pulmonary eval cc chronic cough some worse x 6 months assoc with sense of pnds worse at hs and in am with mostly mucoid sputum.   rec Zyrtec is best option for drainage, try taking one at bedtime as needed The key is to stop smoking completely before smoking completely stops you!  Chantix starter pack > didn't work/ intolerant  12/04/2012 f/u ov/Wert using saba twice daily  Chief Complaint  Patient presents with  . Followup with PFT    Pt states SOB and cough have improved some, has good days and bad days.    main issue is nasal congestion now s purulent sputum, using saba bid with no limiting sob  No obvious daytime variabilty or   cp or chest tightness, subjective wheeze overt sinus or hb symptoms. No unusual exp hx or h/o childhood pna/ asthma or premature birth to her knowledge.  Sleeping ok without nocturnal  or early am exacerbation  of respiratory  c/o's or need for noct saba. Also denies any obvious fluctuation of symptoms with weather or environmental changes or other aggravating or alleviating factors except as outlined above  Current Medications, Allergies, Past Medical History, Past Surgical History, Family History, and Social History were reviewed in [employer] record.  ROS  The following are not active complaints unless bolded sore throat, dysphagia, dental problems, itching, sneezing,  nasal congestion or excess/ purulent secretions, ear ache,   fever, chills, sweats, unintended wt loss, pleuritic or exertional cp, hemoptysis,  orthopnea pnd or leg swelling, presyncope, palpitations, heartburn, abdominal pain, anorexia, nausea, vomiting, diarrhea  or change in bowel or urinary habits, change in stools or urine,  dysuria,hematuria,  rash, arthralgias, visual complaints, headache, numbness weakness or ataxia or problems with walking or coordination,  change in mood/affect or memory.             Objective:   Physical Exam  Very pleasant amb wf nad  12/04/2012        117  Wt Readings from Last 3 Encounters:  08/01/12 123 lb 6.4 oz (55.974 kg)  12/06/11 126 lb 5.2 oz (57.3 kg)  12/06/11 126 lb 5.2 oz (57.3 kg)   HEENT: nl dentition, turbinates, and orophanx. Nl external ear canals without cough reflex   NECK :  without JVD/Nodes/TM/ nl carotid upstrokes bilaterally   LUNGS: no acc muscle use, clear to A and P bilaterally without cough on insp or exp maneuvers   CV:  RRR  no s3 or murmur or increase in P2, no edema   ABD:  soft and nontender with nl excursion in the supine position. No bruits or organomegaly, bowel sounds nl  MS:  warm without deformities, calf tenderness, cyanosis or clubbing  SKIN: warm and dry without lesions    NEURO:  alert, approp, no deficits        CXR  08/01/2012 :   COPD without acute finding.        Assessment & Plan:

## 2012-12-04 NOTE — Assessment & Plan Note (Signed)
Nothing to do for now, re-visit this issue post op from planned spine surgery   Discussed in detail all the  indications, usual  risks and alternatives  relative to the benefits with patient who agrees to proceed with conservative rx as outlined.

## 2012-12-04 NOTE — Patient Instructions (Addendum)
Start symbicort 160 Take 2 puffs first thing in am and then another 2 puffs about 12 hours later.    Only use your albuterol (proaire) as a rescue medication to be used if you can't catch your breath by resting or doing a relaxed purse lip breathing pattern. The less you use it, the better it will work when you need it.   Stop smoking at all costs, this the most important aspect of your care.  Please schedule a follow up office visit in 6 weeks, call sooner if needed and we'll address the nodules on your lungs then

## 2012-12-08 MED ORDER — CEFAZOLIN SODIUM-DEXTROSE 2-3 GM-% IV SOLR
2.0000 g | INTRAVENOUS | Status: AC
  Administered 2012-12-09: 2 g via INTRAVENOUS
  Filled 2012-12-08: qty 50

## 2012-12-08 MED ORDER — DEXAMETHASONE SODIUM PHOSPHATE 10 MG/ML IJ SOLN
10.0000 mg | INTRAMUSCULAR | Status: DC
  Filled 2012-12-08: qty 1

## 2012-12-09 ENCOUNTER — Inpatient Hospital Stay (HOSPITAL_COMMUNITY): Payer: BC Managed Care – PPO | Admitting: Anesthesiology

## 2012-12-09 ENCOUNTER — Encounter (HOSPITAL_COMMUNITY): Payer: Self-pay | Admitting: Anesthesiology

## 2012-12-09 ENCOUNTER — Encounter (HOSPITAL_COMMUNITY)
Admission: RE | Disposition: A | Discharge status: Home / Self Care | Payer: Self-pay | Source: Ambulatory Visit | Attending: Neurosurgery

## 2012-12-09 ENCOUNTER — Encounter (HOSPITAL_COMMUNITY): Payer: Self-pay | Admitting: *Deleted

## 2012-12-09 ENCOUNTER — Inpatient Hospital Stay (HOSPITAL_COMMUNITY): Payer: BC Managed Care – PPO

## 2012-12-09 ENCOUNTER — Inpatient Hospital Stay (HOSPITAL_COMMUNITY)
Admission: RE | Admit: 2012-12-09 | Discharge: 2012-12-11 | DRG: 756 | Disposition: A | Discharge status: Home / Self Care | Payer: BC Managed Care – PPO | Source: Ambulatory Visit | Attending: Neurosurgery | Admitting: Neurosurgery

## 2012-12-09 DIAGNOSIS — IMO0002 Reserved for concepts with insufficient information to code with codable children: Principal | ICD-10-CM | POA: Diagnosis present

## 2012-12-09 DIAGNOSIS — F329 Major depressive disorder, single episode, unspecified: Secondary | ICD-10-CM | POA: Diagnosis present

## 2012-12-09 DIAGNOSIS — E039 Hypothyroidism, unspecified: Secondary | ICD-10-CM | POA: Diagnosis present

## 2012-12-09 DIAGNOSIS — Z981 Arthrodesis status: Secondary | ICD-10-CM

## 2012-12-09 DIAGNOSIS — Y838 Other surgical procedures as the cause of abnormal reaction of the patient, or of later complication, without mention of misadventure at the time of the procedure: Secondary | ICD-10-CM | POA: Diagnosis present

## 2012-12-09 DIAGNOSIS — F17200 Nicotine dependence, unspecified, uncomplicated: Secondary | ICD-10-CM | POA: Diagnosis present

## 2012-12-09 DIAGNOSIS — M129 Arthropathy, unspecified: Secondary | ICD-10-CM | POA: Diagnosis present

## 2012-12-09 DIAGNOSIS — Z882 Allergy status to sulfonamides status: Secondary | ICD-10-CM

## 2012-12-09 DIAGNOSIS — J45909 Unspecified asthma, uncomplicated: Secondary | ICD-10-CM | POA: Diagnosis present

## 2012-12-09 DIAGNOSIS — F3289 Other specified depressive episodes: Secondary | ICD-10-CM | POA: Diagnosis present

## 2012-12-09 DIAGNOSIS — Y9210 Unspecified residential institution as the place of occurrence of the external cause: Secondary | ICD-10-CM | POA: Diagnosis present

## 2012-12-09 HISTORY — PX: LUMBAR FUSION: SHX111

## 2012-12-09 SURGERY — POSTERIOR LUMBAR FUSION 1 LEVEL
Anesthesia: General | Site: Spine Lumbar | Wound class: Clean

## 2012-12-09 MED ORDER — BACITRACIN 50000 UNITS IM SOLR
INTRAMUSCULAR | Status: AC
  Filled 2012-12-09: qty 1

## 2012-12-09 MED ORDER — HYDROMORPHONE HCL PF 1 MG/ML IJ SOLN: 0.5000 mg | INTRAMUSCULAR | Status: DC | PRN

## 2012-12-09 MED ORDER — MENTHOL 3 MG MT LOZG: 1.0000 | LOZENGE | OROMUCOSAL | Status: DC | PRN

## 2012-12-09 MED ORDER — HYDROMORPHONE HCL PF 1 MG/ML IJ SOLN
INTRAMUSCULAR | Status: AC
  Filled 2012-12-09: qty 1

## 2012-12-09 MED ORDER — SODIUM CHLORIDE 0.9 % IJ SOLN
3.0000 mL | Freq: Two times a day (BID) | INTRAMUSCULAR | Status: DC
  Administered 2012-12-10 (×2): 3 mL via INTRAVENOUS

## 2012-12-09 MED ORDER — CYCLOBENZAPRINE HCL 10 MG PO TABS
ORAL_TABLET | ORAL | Status: AC
  Filled 2012-12-09: qty 1

## 2012-12-09 MED ORDER — FENTANYL CITRATE 0.05 MG/ML IJ SOLN: 50.0000 ug | Freq: Once | INTRAMUSCULAR | Status: DC

## 2012-12-09 MED ORDER — DOCUSATE SODIUM 100 MG PO CAPS
100.0000 mg | ORAL_CAPSULE | Freq: Two times a day (BID) | ORAL | Status: DC
  Administered 2012-12-09 – 2012-12-10 (×4): 100 mg via ORAL
  Filled 2012-12-09 (×3): qty 1

## 2012-12-09 MED ORDER — ARTIFICIAL TEARS OP OINT
TOPICAL_OINTMENT | OPHTHALMIC | Status: DC | PRN
  Administered 2012-12-09: 1 via OPHTHALMIC

## 2012-12-09 MED ORDER — HEMOSTATIC AGENTS (NO CHARGE) OPTIME
TOPICAL | Status: DC | PRN
  Administered 2012-12-09: 1 via TOPICAL

## 2012-12-09 MED ORDER — LIDOCAINE HCL (CARDIAC) 20 MG/ML IV SOLN
INTRAVENOUS | Status: DC | PRN
  Administered 2012-12-09: 100 mg via INTRAVENOUS

## 2012-12-09 MED ORDER — ACETAMINOPHEN 325 MG PO TABS
650.0000 mg | ORAL_TABLET | ORAL | Status: DC | PRN
  Administered 2012-12-10 (×2): 650 mg via ORAL
  Filled 2012-12-09 (×2): qty 2

## 2012-12-09 MED ORDER — LACTATED RINGERS IV SOLN
INTRAVENOUS | Status: DC | PRN
  Administered 2012-12-09 (×2): via INTRAVENOUS

## 2012-12-09 MED ORDER — SODIUM CHLORIDE 0.9 % IV SOLN
INTRAVENOUS | Status: AC
  Filled 2012-12-09: qty 500

## 2012-12-09 MED ORDER — CYCLOBENZAPRINE HCL 10 MG PO TABS
10.0000 mg | ORAL_TABLET | Freq: Three times a day (TID) | ORAL | Status: DC | PRN
  Administered 2012-12-09: 10 mg via ORAL

## 2012-12-09 MED ORDER — NEOSTIGMINE METHYLSULFATE 1 MG/ML IJ SOLN
INTRAMUSCULAR | Status: DC | PRN
  Administered 2012-12-09: 4 mg via INTRAVENOUS

## 2012-12-09 MED ORDER — THROMBIN 20000 UNITS EX SOLR
CUTANEOUS | Status: DC | PRN
  Administered 2012-12-09: 09:00:00 via TOPICAL

## 2012-12-09 MED ORDER — PROPOFOL 10 MG/ML IV BOLUS
INTRAVENOUS | Status: DC | PRN
  Administered 2012-12-09: 140 mg via INTRAVENOUS

## 2012-12-09 MED ORDER — SODIUM CHLORIDE 0.9 % IV SOLN
250.0000 mL | INTRAVENOUS | Status: DC
  Administered 2012-12-09: 250 mL via INTRAVENOUS

## 2012-12-09 MED ORDER — SODIUM CHLORIDE 0.9 % IR SOLN
Status: DC | PRN
  Administered 2012-12-09 (×2)

## 2012-12-09 MED ORDER — PHENOL 1.4 % MT LIQD: 1.0000 | OROMUCOSAL | Status: DC | PRN

## 2012-12-09 MED ORDER — SERTRALINE HCL 100 MG PO TABS
100.0000 mg | ORAL_TABLET | Freq: Every day | ORAL | Status: DC
  Administered 2012-12-09 – 2012-12-11 (×3): 100 mg via ORAL
  Filled 2012-12-09 (×3): qty 1

## 2012-12-09 MED ORDER — LIDOCAINE-EPINEPHRINE 1 %-1:100000 IJ SOLN
INTRAMUSCULAR | Status: DC | PRN
  Administered 2012-12-09: 10 mL

## 2012-12-09 MED ORDER — LEVOTHYROXINE SODIUM 88 MCG PO TABS
88.0000 ug | ORAL_TABLET | Freq: Every day | ORAL | Status: DC
  Administered 2012-12-10 – 2012-12-11 (×2): 88 ug via ORAL
  Filled 2012-12-09 (×3): qty 1

## 2012-12-09 MED ORDER — MIDAZOLAM HCL 2 MG/2ML IJ SOLN: 1.0000 mg | INTRAMUSCULAR | Status: DC | PRN

## 2012-12-09 MED ORDER — ALBUTEROL SULFATE HFA 108 (90 BASE) MCG/ACT IN AERS
INHALATION_SPRAY | RESPIRATORY_TRACT | Status: DC | PRN
  Administered 2012-12-09: 2 via RESPIRATORY_TRACT

## 2012-12-09 MED ORDER — PHENYLEPHRINE HCL 10 MG/ML IJ SOLN
INTRAMUSCULAR | Status: DC | PRN
  Administered 2012-12-09: 40 ug via INTRAVENOUS
  Administered 2012-12-09 (×2): 80 ug via INTRAVENOUS
  Administered 2012-12-09: 160 ug via INTRAVENOUS
  Administered 2012-12-09: 40 ug via INTRAVENOUS

## 2012-12-09 MED ORDER — VECURONIUM BROMIDE 10 MG IV SOLR
INTRAVENOUS | Status: DC | PRN
  Administered 2012-12-09 (×2): 1 mg via INTRAVENOUS
  Administered 2012-12-09: 2 mg via INTRAVENOUS

## 2012-12-09 MED ORDER — BUDESONIDE-FORMOTEROL FUMARATE 160-4.5 MCG/ACT IN AERO
2.0000 | INHALATION_SPRAY | Freq: Two times a day (BID) | RESPIRATORY_TRACT | Status: DC
  Administered 2012-12-10 – 2012-12-11 (×3): 2 via RESPIRATORY_TRACT
  Filled 2012-12-09: qty 6

## 2012-12-09 MED ORDER — FENTANYL CITRATE 0.05 MG/ML IJ SOLN
INTRAMUSCULAR | Status: DC | PRN
  Administered 2012-12-09 (×4): 50 ug via INTRAVENOUS

## 2012-12-09 MED ORDER — ALBUTEROL SULFATE HFA 108 (90 BASE) MCG/ACT IN AERS
2.0000 | INHALATION_SPRAY | Freq: Four times a day (QID) | RESPIRATORY_TRACT | Status: DC | PRN
  Filled 2012-12-09: qty 6.7

## 2012-12-09 MED ORDER — GLYCOPYRROLATE 0.2 MG/ML IJ SOLN
INTRAMUSCULAR | Status: DC | PRN
  Administered 2012-12-09: .7 mg via INTRAVENOUS

## 2012-12-09 MED ORDER — OXYCODONE-ACETAMINOPHEN 5-325 MG PO TABS
1.0000 | ORAL_TABLET | ORAL | Status: DC | PRN
  Administered 2012-12-09 (×2): 2 via ORAL
  Administered 2012-12-10 (×2): 1 via ORAL
  Filled 2012-12-09: qty 1
  Filled 2012-12-09 (×2): qty 2
  Filled 2012-12-09: qty 1

## 2012-12-09 MED ORDER — HYDROMORPHONE HCL PF 1 MG/ML IJ SOLN
0.2500 mg | INTRAMUSCULAR | Status: DC | PRN
  Administered 2012-12-09 (×2): 0.5 mg via INTRAVENOUS

## 2012-12-09 MED ORDER — 0.9 % SODIUM CHLORIDE (POUR BTL) OPTIME
TOPICAL | Status: DC | PRN
  Administered 2012-12-09: 1000 mL

## 2012-12-09 MED ORDER — MIDAZOLAM HCL 5 MG/5ML IJ SOLN
INTRAMUSCULAR | Status: DC | PRN
  Administered 2012-12-09 (×2): 1 mg via INTRAVENOUS

## 2012-12-09 MED ORDER — ACETAMINOPHEN 650 MG RE SUPP: 650.0000 mg | RECTAL | Status: DC | PRN

## 2012-12-09 MED ORDER — ONDANSETRON HCL 4 MG/2ML IJ SOLN
INTRAMUSCULAR | Status: DC | PRN
  Administered 2012-12-09: 4 mg via INTRAVENOUS

## 2012-12-09 MED ORDER — ALUM & MAG HYDROXIDE-SIMETH 200-200-20 MG/5ML PO SUSP: 30.0000 mL | Freq: Four times a day (QID) | ORAL | Status: DC | PRN

## 2012-12-09 MED ORDER — ROCURONIUM BROMIDE 100 MG/10ML IV SOLN
INTRAVENOUS | Status: DC | PRN
  Administered 2012-12-09: 40 mg via INTRAVENOUS
  Administered 2012-12-09: 10 mg via INTRAVENOUS

## 2012-12-09 MED ORDER — PROMETHAZINE HCL 25 MG/ML IJ SOLN: 6.2500 mg | INTRAMUSCULAR | Status: DC | PRN

## 2012-12-09 MED ORDER — CEFAZOLIN SODIUM 1-5 GM-% IV SOLN
1.0000 g | Freq: Three times a day (TID) | INTRAVENOUS | Status: AC
  Administered 2012-12-09 (×2): 1 g via INTRAVENOUS
  Filled 2012-12-09 (×3): qty 50

## 2012-12-09 MED ORDER — SODIUM CHLORIDE 0.9 % IJ SOLN: 3.0000 mL | INTRAMUSCULAR | Status: DC | PRN

## 2012-12-09 MED ORDER — ONDANSETRON HCL 4 MG/2ML IJ SOLN: 4.0000 mg | INTRAMUSCULAR | Status: DC | PRN

## 2012-12-09 MED ORDER — PHENYLEPHRINE HCL 10 MG/ML IJ SOLN
10.0000 mg | INTRAVENOUS | Status: DC | PRN
  Administered 2012-12-09: 15 ug/min via INTRAVENOUS

## 2012-12-09 MED FILL — Sodium Chloride Irrigation Soln 0.9%: Qty: 3000 | Status: AC

## 2012-12-09 MED FILL — Sodium Chloride IV Soln 0.9%: INTRAVENOUS | Qty: 1000 | Status: AC

## 2012-12-09 MED FILL — Heparin Sodium (Porcine) Inj 1000 Unit/ML: INTRAMUSCULAR | Qty: 30 | Status: AC

## 2012-12-09 SURGICAL SUPPLY — 72 items
3.2MMX18.3MM HELLOCOIDAL RASP LONG ×2 IMPLANT
BAG DECANTER FOR FLEXI CONT (MISCELLANEOUS) ×4 IMPLANT
BANDAGE GAUZE ELAST BULKY 4 IN (GAUZE/BANDAGES/DRESSINGS) ×2 IMPLANT
BENZOIN TINCTURE PRP APPL 2/3 (GAUZE/BANDAGES/DRESSINGS) ×4 IMPLANT
BLADE SURG 11 STRL SS (BLADE) ×2 IMPLANT
BLADE SURG ROTATE 9660 (MISCELLANEOUS) IMPLANT
BRUSH SCRUB EZ PLAIN DRY (MISCELLANEOUS) ×2 IMPLANT
BUR MATCHSTICK NEURO 3.0 LAGG (BURR) ×2 IMPLANT
BUR PRECISION FLUTE 6.0 (BURR) ×2 IMPLANT
CANISTER SUCTION 2500CC (MISCELLANEOUS) ×2 IMPLANT
CAP LOCKING REVERE (Cap) ×10 IMPLANT
CLOTH BEACON ORANGE TIMEOUT ST (SAFETY) ×2 IMPLANT
CONT SPEC 4OZ CLIKSEAL STRL BL (MISCELLANEOUS) ×6 IMPLANT
COVER BACK TABLE 24X17X13 BIG (DRAPES) IMPLANT
COVER TABLE BACK 60X90 (DRAPES) ×2 IMPLANT
DECANTER SPIKE VIAL GLASS SM (MISCELLANEOUS) ×2 IMPLANT
DERMABOND ADHESIVE PROPEN (GAUZE/BANDAGES/DRESSINGS) ×2
DERMABOND ADVANCED (GAUZE/BANDAGES/DRESSINGS)
DERMABOND ADVANCED .7 DNX12 (GAUZE/BANDAGES/DRESSINGS) IMPLANT
DERMABOND ADVANCED .7 DNX6 (GAUZE/BANDAGES/DRESSINGS) ×2 IMPLANT
DRAPE C-ARM 42X72 X-RAY (DRAPES) ×4 IMPLANT
DRAPE LAPAROTOMY 100X72X124 (DRAPES) ×2 IMPLANT
DRAPE POUCH INSTRU U-SHP 10X18 (DRAPES) ×2 IMPLANT
DRAPE PROXIMA HALF (DRAPES) IMPLANT
DRAPE SURG 17X23 STRL (DRAPES) ×4 IMPLANT
DRSG OPSITE 4X5.5 SM (GAUZE/BANDAGES/DRESSINGS) ×8 IMPLANT
DURAPREP 26ML APPLICATOR (WOUND CARE) ×2 IMPLANT
ELECT REM PT RETURN 9FT ADLT (ELECTROSURGICAL) ×2
ELECTRODE REM PT RTRN 9FT ADLT (ELECTROSURGICAL) ×1 IMPLANT
EVACUATOR 1/8 PVC DRAIN (DRAIN) ×2 IMPLANT
EVACUATOR 3/16  PVC DRAIN (DRAIN) ×1
EVACUATOR 3/16 PVC DRAIN (DRAIN) ×1 IMPLANT
GAUZE SPONGE 4X4 16PLY XRAY LF (GAUZE/BANDAGES/DRESSINGS) ×2 IMPLANT
GLOVE BIO SURGEON STRL SZ8 (GLOVE) ×4 IMPLANT
GLOVE BIOGEL PI IND STRL 8.5 (GLOVE) ×1 IMPLANT
GLOVE BIOGEL PI INDICATOR 8.5 (GLOVE) ×1
GLOVE ECLIPSE 7.5 STRL STRAW (GLOVE) IMPLANT
GLOVE ECLIPSE 8.5 STRL (GLOVE) ×2 IMPLANT
GLOVE EXAM NITRILE LRG STRL (GLOVE) IMPLANT
GLOVE EXAM NITRILE MD LF STRL (GLOVE) IMPLANT
GLOVE EXAM NITRILE XL STR (GLOVE) IMPLANT
GLOVE EXAM NITRILE XS STR PU (GLOVE) IMPLANT
GLOVE INDICATOR 8.5 STRL (GLOVE) ×4 IMPLANT
GOWN BRE IMP SLV AUR LG STRL (GOWN DISPOSABLE) IMPLANT
GOWN BRE IMP SLV AUR XL STRL (GOWN DISPOSABLE) ×4 IMPLANT
GOWN STRL REIN 2XL LVL4 (GOWN DISPOSABLE) ×6 IMPLANT
KIT BASIN OR (CUSTOM PROCEDURE TRAY) ×2 IMPLANT
KIT INFUSE X SMALL 1.4CC (Orthopedic Implant) ×2 IMPLANT
KIT ROOM TURNOVER OR (KITS) ×2 IMPLANT
MILL MEDIUM DISP (BLADE) ×2 IMPLANT
NEEDLE HYPO 25X1 1.5 SAFETY (NEEDLE) ×2 IMPLANT
NS IRRIG 1000ML POUR BTL (IV SOLUTION) ×2 IMPLANT
PACK LAMINECTOMY NEURO (CUSTOM PROCEDURE TRAY) ×2 IMPLANT
PAD ARMBOARD 7.5X6 YLW CONV (MISCELLANEOUS) ×10 IMPLANT
PUTTY BONE DBX 5CC MIX (Putty) ×2 IMPLANT
RASP 3.0MM (RASP) ×2 IMPLANT
ROD CURVED 5.5MMX60MM (Rod) ×2 IMPLANT
ROD CURVED 5.5X40MM (Rod) ×2 IMPLANT
SCREW PEDICLE 8.5MMX35MM (Screw) ×2 IMPLANT
SPONGE GAUZE 4X4 12PLY (GAUZE/BANDAGES/DRESSINGS) ×2 IMPLANT
SPONGE LAP 4X18 X RAY DECT (DISPOSABLE) IMPLANT
SPONGE SURGIFOAM ABS GEL 100 (HEMOSTASIS) ×4 IMPLANT
STRIP CLOSURE SKIN 1/2X4 (GAUZE/BANDAGES/DRESSINGS) ×6 IMPLANT
SUT VIC AB 0 CT1 18XCR BRD8 (SUTURE) ×2 IMPLANT
SUT VIC AB 0 CT1 8-18 (SUTURE) ×2
SUT VIC AB 2-0 CT1 18 (SUTURE) ×4 IMPLANT
SUT VICRYL 4-0 PS2 18IN ABS (SUTURE) ×4 IMPLANT
SYR 20ML ECCENTRIC (SYRINGE) ×2 IMPLANT
TOWEL OR 17X24 6PK STRL BLUE (TOWEL DISPOSABLE) ×2 IMPLANT
TOWEL OR 17X26 10 PK STRL BLUE (TOWEL DISPOSABLE) ×2 IMPLANT
TRAY FOLEY CATH 14FRSI W/METER (CATHETERS) ×2 IMPLANT
WATER STERILE IRR 1000ML POUR (IV SOLUTION) ×2 IMPLANT

## 2012-12-09 NOTE — Transfer of Care (Signed)
Immediate Anesthesia Transfer of Care Note  Patient: Donna French  Procedure(s) Performed: Procedure(s) with comments: POSTERIOR LUMBAR FUSION 1 LEVEL,Lumbar five-Sacral One with Right iliac crest graft. (N/A) - Lumbar five-sacral one,right iliac crest graft  Patient Location: PACU  Anesthesia Type:General  Level of Consciousness: awake, alert  and oriented  Airway & Oxygen Therapy: Patient Spontanous Breathing and Patient connected to nasal cannula oxygen  Post-op Assessment: Report given to PACU RN, Post -op Vital signs reviewed and stable and Patient moving all extremities X 4  Post vital signs: Reviewed and stable  Complications: No apparent anesthesia complications

## 2012-12-09 NOTE — Anesthesia Preprocedure Evaluation (Addendum)
Anesthesia Evaluation  Patient identified by MRN, date of birth, ID band Patient awake    Reviewed: Allergy & Precautions, H&P , NPO status , Patient's Chart, lab work & pertinent test results  Airway Mallampati: I TM Distance: >3 FB Neck ROM: Limited    Dental   Pulmonary asthma , COPDCurrent Smoker,  Lung nodules + rhonchi         Cardiovascular Rhythm:Regular Rate:Normal     Neuro/Psych Depression    GI/Hepatic   Endo/Other  Hypothyroidism   Renal/GU      Musculoskeletal   Abdominal   Peds  Hematology   Anesthesia Other Findings hoarse  Reproductive/Obstetrics                          Anesthesia Physical Anesthesia Plan  ASA: III  Anesthesia Plan: General   Post-op Pain Management:    Induction: Intravenous  Airway Management Planned: Oral ETT and Video Laryngoscope Planned  Additional Equipment:   Intra-op Plan:   Post-operative Plan: Extubation in OR  Informed Consent: I have reviewed the patients History and Physical, chart, labs and discussed the procedure including the risks, benefits and alternatives for the proposed anesthesia with the patient or authorized representative who has indicated his/her understanding and acceptance.     Plan Discussed with: CRNA and Surgeon  Anesthesia Plan Comments:         Anesthesia Quick Evaluation

## 2012-12-09 NOTE — Anesthesia Procedure Notes (Addendum)
Procedure Name: Intubation Date/Time: 12/09/2012 7:33 AM Performed by: Gayla Medicus Pre-anesthesia Checklist: Patient identified, Timeout performed, Emergency Drugs available, Suction available and Patient being monitored Patient Re-evaluated:Patient Re-evaluated prior to inductionOxygen Delivery Method: Circle system utilized Preoxygenation: Pre-oxygenation with 100% oxygen Intubation Type: IV induction Ventilation: Mask ventilation without difficulty Laryngoscope Size: Mac and 3 Grade View: Grade II Tube type: Oral Tube size: 7.5 mm Number of attempts: 1 Airway Equipment and Method: Stylet Placement Confirmation: ETT inserted through vocal cords under direct vision,  positive ETCO2 and breath sounds checked- equal and bilateral Secured at: 21 cm Tube secured with: Tape Dental Injury: Teeth and Oropharynx as per pre-operative assessment

## 2012-12-09 NOTE — Preoperative (Signed)
Beta Blockers   Reason not to administer Beta Blockers:Not Applicable 

## 2012-12-09 NOTE — H&P (Signed)
Donna French is an 62 y.o. female.   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 Workup and followup x-ray showed continued loosening of her S1 screws and lack of bone healing. Because of this I recommended a redo posterior lateral L5-S1 fusion I have extensively reviewed the risks and benefits of cutting the rod at L3-4 and L4-5 on opposite sides replaced and the rod at those levels redo posterior lateral fusion using iliac crest bone graft with bone augmentation or his concluded DBX and BMP and replacement of her S1 screws I have also reviewed the perioperative course expectations about alternatives of surgery she understands this and agrees to proceed forward.  Past Medical History  Diagnosis Date  . Hypothyroidism   . Depression   . Asthma     Hx: of  . Pneumonia     Hx: of  . Arthritis     Hx: of T/O    Past Surgical History  Procedure Laterality Date  . Cervical fusion  x2  . Tonsillectomy    . Appendectomy    . Abdominal hysterectomy    . Cholecystectomy    . Breast surgery      lumpectomy  . Lumbar laminectomy/decompression microdiscectomy    . Diagnostic laparoscopy      Hx: of exploratory Lap  . Dilation and curettage of uterus      Family History  Problem Relation Age of Onset  . Allergies Brother   . Allergies Mother   . Asthma Mother   . Asthma Sister   . Heart disease Brother   . Lung cancer Maternal Uncle     was a smoker   Social History:  reports that she has been smoking  Cigarettes.  She has a 26.5 pack-year smoking history. She has never used smokeless tobacco. She reports that she does not drink alcohol or use illicit drugs.  Allergies:  Allergies  Allergen Reactions  . Codeine Nausea And Vomiting  . Darvon (Propoxyphene Hcl) Nausea And Vomiting  . Fish Allergy Itching and Swelling  . Sulfa Antibiotics Nausea And Vomiting    Medications Prior to Admission  Medication Sig Dispense Refill  . albuterol (PROAIR HFA) 108 (90 BASE) MCG/ACT inhaler Inhale 2 puffs into the lungs every 6 (six) hours as needed for wheezing.      . budesonide-formoterol (SYMBICORT) 160-4.5 MCG/ACT inhaler Take 2 puffs first thing in am and then another 2 puffs about 12 hours later.  1 Inhaler  12  . levothyroxine (SYNTHROID, LEVOTHROID) 88 MCG tablet Take 88 mcg by mouth daily.      . sertraline (ZOLOFT) 100 MG tablet Take 100 mg by mouth daily.        No results found for this or any previous visit (from the past 48 hour(s)). No results found.  Review of Systems  Constitutional: Negative.   HENT: Negative.   Eyes: Negative.   Respiratory: Negative.   Cardiovascular: Negative.   Gastrointestinal: Negative.   Genitourinary: Negative.   Musculoskeletal: Positive for myalgias, back  pain and joint pain.  Skin: Negative.   Neurological: Positive for tingling and sensory change.  Endo/Heme/Allergies: Negative.   Psychiatric/Behavioral: Negative.     Blood pressure 113/56, pulse 68, temperature 97.5 F (36.4 C), resp. rate 18, SpO2 100.00%. Physical Exam  Constitutional: She is oriented to person, place, and time. She appears well-developed.  HENT:  Head: Normocephalic.  Eyes: Pupils are equal, round, and reactive to light.  Neck: Normal range of motion.  Cardiovascular: Normal rate.   Respiratory: Effort normal and breath sounds normal.  GI: Soft.  Neurological: She is alert and oriented to person, place, and time. She has normal strength. GCS eye subscore is 4. GCS  verbal subscore is 5. GCS motor subscore is 6.  Reflex Scores:      Tricep reflexes are 2+ on the right side and 2+ on the left side.      Bicep reflexes are 2+ on the right side and 2+ on the left side.      Brachioradialis reflexes are 2+ on the right side and 2+ on the left side.      Patellar reflexes are 0 on the right side and 0 on the left side.      Achilles reflexes are 0 on the right side and 0 on the left side. Strength is 5 out of 5 in her iliopsoas, quads, hamstrings, gastrocs, anterior tibialis, EHL.     Assessment/Plan 62 year old female presents for redo L5-S1 fusion for pseudoarthrosis  Donna French P 12/09/2012, 7:16 AM

## 2012-12-09 NOTE — Anesthesia Postprocedure Evaluation (Signed)
  Anesthesia Post-op Note  Patient: Donna French  Procedure(s) Performed: Procedure(s) with comments: POSTERIOR LUMBAR FUSION 1 LEVEL,Lumbar five-Sacral One with Right iliac crest graft. (N/A) - Lumbar five-sacral one,right iliac crest graft  Patient Location: PACU  Anesthesia Type:General  Level of Consciousness: awake  Airway and Oxygen Therapy: Patient Spontanous Breathing  Post-op Pain: mild  Post-op Assessment: Post-op Vital signs reviewed, Patient's Cardiovascular Status Stable, Respiratory Function Stable, Patent Airway, No signs of Nausea or vomiting and Pain level controlled  Post-op Vital Signs: stable  Complications: No apparent anesthesia complications

## 2012-12-09 NOTE — Op Note (Signed)
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  Postoperative diagnosis: Same  Procedure: Expiration of fusion removal of hardware with cutting of the rod between L4-5 on the right and L3 for the left and removal of loose S1 pedicle screws with replacement with 2 sizes larger 8.5 x 35 mm pedicle screws from the 5.5} Revere pedicle screw system  Redo posterior lateral fusion L4-S1 using iliac crest bone graft and DBX and an extra small BMP kit  Through a separate skin incision harvesting of iliac crest bone graft from the right posterior superior iliac crest  Placement of 2 Hemovac drains  Surgeon: Jillyn Hidden Carmin Alvidrez  Assistant: Barnett Abu  Anesthesia: Gen.  EBL: Minimal  History of present illness: Patient is a very pleasant 62 year old female who underwent a T10-S1 fusion a year ago [place] to suppress worsening back pain and right leg pain and workup has revealed a pseudoarthrosis at L5-S1 with loosening of her S1 screws patient was placed and a bone stimulator with no significant radiographic result of improvement in her fusion. So patient was recommended redo posterior lateral fusion L5-S1 I extensively reviewed the risks and benefits of the operation with her as well as perioperative course and expectations of outcome alternatives of surgery she understood and agreed to proceed forward.  Operative procedure: Patient brought into the or was induced and general anesthesia positioned prone the Wilson frame her back was prepped and draped in routine sterile fashion the right posterior superior iliac crest was diagrammed out and starting incision proximally 4 fingerbreadths away from the midline extending laterally after infiltration of 5 cc lidocaine with epi dissection was carried out on the posterior suprailiac spine and the muscles dissected off of the posterior iliac crest and wing using a Taylor retractor chisels and gouges the posterior cortical surface and cancellous  bone was harvested once an adequate amount bone was harvested this was packed with Gelfoam a drain was placed and was copiously irrigated and closed with interrupted Vicryl and a running 4 subcuticular in the skin. Then through separate skin incision a midline incision was made extending about a third of the way up her previous incision and exposure was carried out through the scar tissue exposing the hardware from the cross-link between L3-4 down to just below the S1 pedicle screw posterior lateral dissection was carried out the rods were then cut between L3-4 on the left and L4-5 in the right with a titanium cutting drill bit. The nuts were then removed and the S1 screws were noted be hypermobile completely loose and these were removed also probed and felt to be competent and carried out further posterior lateral dissection exposing the TPS at L4-5 and S1 bilaterally as well as the alar wing S1. At this point the wound is copiously irrigated meticulous hemostasis was maintained aggressive decortication was care MTPs at L4-L5 and S1 along with the alar wing at S1 iliac crest bone graft, BMP, and DBX were all mixed and placed in the posterolateral space extending from L4 down to S1 bilaterally and then to 8.5 x 35 mm pedicle screws were inserted at S1 the screws had excellent purchase and carried down to engage the anterior cortical surface 2 rods were then fashioned and sized inserted top tightness tightened down posterior fluoroscopy confirmed good position of the screws and rods were then and a drain was placed the wounds closed in layers with after Vicryl and a running 4 subcuticular benzo and Steri-Strips were applied an dura by the patient  recovered in stable condition. At the end of case on it counts and sponge counts were correct.

## 2012-12-10 MED ORDER — SODIUM CHLORIDE 0.9 % IV BOLUS (SEPSIS)
500.0000 mL | Freq: Once | INTRAVENOUS | Status: AC
  Administered 2012-12-11: 500 mL via INTRAVENOUS

## 2012-12-10 MED ORDER — SODIUM CHLORIDE 0.9 % IV BOLUS (SEPSIS)
500.0000 mL | Freq: Once | INTRAVENOUS | Status: AC
  Administered 2012-12-10: 500 mL via INTRAVENOUS

## 2012-12-10 NOTE — Progress Notes (Signed)
Patient ID: Donna French, female   DOB: Mar 29, 1951, 62 y.o.   MRN: [id number] The patient is doing well pain is well controlled she is having no radicular symptoms back pain is manageable.     Strength is 5 out of 5 wound is clean and dry  Continue to mobilize today physical therapy possible discharge later today

## 2012-12-10 NOTE — Progress Notes (Signed)
Patient's Diastolic BP is running in the 16'X. IV NaCl bolus was given at 1900. BP checked at 2200 and reads 101/33 with temp of 100.3. Rechecked manually and reads 105/37. Tylenol given per patient's request. Dr. Gerlene Fee on call notified and ordered another NaCl bolus. Order carried out. Patient encouraged to use incentive spirometer.

## 2012-12-10 NOTE — Progress Notes (Signed)
Occupational Therapy Evaluation and Discharge Patient Details Name: Donna French MRN: [id number] DOB: [birth year] Today's Date: 12/10/2012 Time: 7829-5621 OT Time Calculation (min): 18 min  OT Assessment / Plan / Recommendation History of present illness 62 y/o female s/p revision L5-S1 fusion.   Clinical Impression   Pt is pleasant and compliant with her back precautions. Pt's husband is also mindful of precautions and helpful in dressing and don/doff brace. Pt able to maintain precautions during functional ADL and transfers. Pt is in no need of further OT services. OT to sign off.    OT Assessment  Patient does not need any further OT services    Follow Up Recommendations  No OT follow up                Precautions / Restrictions Precautions Precautions: Fall;Back Precaution Comments: Pt recalled 1/3 precautions. Pt re-educated Required Braces or Orthoses: Spinal Brace Spinal Brace: Lumbar corset;Applied in sitting position Restrictions Weight Bearing Restrictions: No   Pertinent Vitals/Pain Pt reported pain as 8/10. Pt premedicated 30 min prior to session, and Pt repositioned to chair to sit up for lunch.    ADL  Eating/Feeding: Modified independent Where Assessed - Eating/Feeding: Chair Grooming: Wash/dry hands;Supervision/safety Where Assessed - Grooming: Unsupported standing Upper Body Bathing: Moderate assistance Where Assessed - Upper Body Bathing: Unsupported sitting Lower Body Bathing: Moderate assistance Where Assessed - Lower Body Bathing: Supported sit to stand Upper Body Dressing: Moderate assistance Where Assessed - Upper Body Dressing: Unsupported sitting Lower Body Dressing: Moderate assistance Where Assessed - Lower Body Dressing: Supported sit to Pharmacist, hospital: Supervision/safety Statistician Method: Sit to Barista: Comfort height toilet;Grab bars Toileting - Architect and Hygiene: Independent Where  Assessed - Engineer, mining and Hygiene: Standing Tub/Shower Transfer: Modified independent Web designer Method: Science writer: Walk in shower;Grab bars Equipment Used: Gait belt Transfers/Ambulation Related to ADLs: Pt supervision for transfers and ambulation. Pt able to maintain back precautions during functional transfers and ambulation ADL Comments: Pt able to use toilet and perform peri care maintaining precautions. Pt's husband will be home for the next month to assit with don/doff brace, and with LB dressing/bathing. Pt could get left foot to knee, but right foot only halfway up shin. Pt supervision for sink level ADL and maintained precautions throughout.     OT Goals(Current goals can be found in the care plan section) Acute Rehab OT Goals Patient Stated Goal: home, no more surgeries  Visit Information  Last OT Received On: 12/10/12 Assistance Needed: +1 History of Present Illness: 62 y/o female s/p revision L5-S1 fusion.       Prior Functioning     Home Living Family/patient expects to be discharged to:: Private residence Living Arrangements: Spouse/significant other Available Help at Discharge: Family;Available 24 hours/day Type of Home: House Home Access: Level entry Home Layout: Two level;Able to live on main level with bedroom/bathroom;Laundry or work area in Artist of Steps: 13 Home Equipment: Information systems manager - built in Prior Function Level of Independence: Independent CommentsEngineer, manufacturing systems: No difficulties Dominant Hand: Right         Vision/Perception Vision - History Baseline Vision: Wears glasses all the time Patient Visual Report: No change from baseline Vision - Assessment Eye Alignment: Within Functional Limits   Cognition  Cognition Arousal/Alertness: Awake/alert Behavior During Therapy: WFL for tasks assessed/performed Overall  Cognitive Status: Within Functional Limits for tasks assessed    Extremity/Trunk Assessment Upper Extremity  Assessment Upper Extremity Assessment: Overall WFL for tasks assessed Lower Extremity Assessment Lower Extremity Assessment: Generalized weakness Cervical / Trunk Assessment Cervical / Trunk Assessment: Kyphotic     Mobility Bed Mobility Bed Mobility: Right Sidelying to Sit;Sitting - Scoot to Edge of Bed Right Sidelying to Sit: 5: Supervision;HOB flat;With rails Sitting - Scoot to Edge of Bed: 5: Supervision Sit to Sidelying Right: HOB flat;5: Supervision Details for Bed Mobility Assistance: maintained precautions while performing bed mobility with no cues. Transfers Transfers: Stand to Sit;Sit to Stand Sit to Stand: 4: Min guard Stand to Sit: 4: Min guard Details for Transfer Assistance: Pt min guard for safety        Balance Balance Balance Assessed: Yes Static Standing Balance Static Standing - Balance Support: During functional activity Static Standing - Level of Assistance: 5: Stand by assistance Static Standing - Comment/# of Minutes: occasional LOB with delayed balance reactions (suspect medication)   End of Session OT - End of Session Equipment Utilized During Treatment: Gait belt Activity Tolerance: Patient tolerated treatment well Patient left: in chair;with call bell/phone within reach Nurse Communication: Mobility status  GO     Sherryl Manges 12/10/2012, 12:09 PM

## 2012-12-10 NOTE — Progress Notes (Signed)
   CARE MANAGEMENT NOTE 12/10/2012  Patient:  Donna French, Donna French   Account Number:  [id number]  Date Initiated:  12/10/2012  Documentation initiated by:  Jiles Crocker  Subjective/Objective Assessment:   ADMITTED FOR SURGERY - POSTERIOR LUMBAR FUSION 1 LEVEL     Action/Plan:   PATIENT GOES TO [facility]; LIVES AT HOME WITH SPOUSE; CM FOLLOWING FOR DCP   Anticipated DC Date:  12/11/2012   Anticipated DC Plan:  HOME/SELF CARE      DC Planning Services  CM consult        Status of service:  In process, will continue to follow Medicare Important Message given?  NA - LOS <3 / Initial given by admissions (If response is "NO", the following Medicare IM given date fields will be blank)  Per UR Regulation:  Reviewed for med. necessity/level of care/duration of stay  Comments:  12/10/2012- B Niccolo Burggraf RN,BSN,MHA

## 2012-12-10 NOTE — Evaluation (Addendum)
Physical Therapy Evaluation Patient Details Name: Donna French MRN: [id number] DOB: June 17, 1950 Today's Date: 12/10/2012 Time: 4098-1191 PT Time Calculation (min): 22 min  PT Assessment / Plan / Recommendation History of Present Illness  62 y/o female s/p revision L5-S1 fusion.  Clinical Impression  Presents to PT with below listed impairments impacting functional independence and safety with mobility. Will benefit physical therapy in the acute setting to maximize safety for d/c home with support from husband. Do not anticipate f/u PT needs right now. Husband is able to provide good support for safe d/c. Education provided on benefits of multiple walks daily with support of husband/nsg staff as well as adequate rest throughout the day. Reviewed 3/3 back precautions with patient and husband. Would likely benefit from OPPT once cleared by Dr. Wynetta Emery for further balance training.    PT Assessment  Patient needs continued PT services    Follow Up Recommendations  No PT follow up;Supervision/Assistance - 24 hour    Does the patient have the potential to tolerate intense rehabilitation      Barriers to Discharge        Equipment Recommendations  None recommended by PT    Recommendations for Other Services     Frequency Min 5X/week    Precautions / Restrictions Precautions Precautions: Fall;Back Required Braces or Orthoses: Spinal Brace Spinal Brace: Lumbar corset Restrictions Weight Bearing Restrictions: No   Pertinent Vitals/Pain Reports 5/10 back pain, denies need for pain meds right now, repositioned for comfort following our session to allow for rest      Mobility  Bed Mobility Bed Mobility: Sit to Sidelying Right Sit to Sidelying Right: HOB flat;5: Supervision Details for Bed Mobility Assistance: initial cues for sequencing to adhere to back precautions, good technique noted Transfers Transfers: Sit to Stand;Stand to Sit Sit to Stand: 4: Min guard;4: Min assist Stand  to Sit: 4: Min guard Details for Transfer Assistance: mingaurdA initially, slight posterior LOB upon standing needing minA to correct (husband quick to jump in and help as well), cues for safe technique and controlled descent to bed Ambulation/Gait Ambulation/Gait Assistance: 4: Min guard;4: Min Environmental consultant (Feet): 150 Feet Assistive device: None Ambulation/Gait Assistance Details: close gaurding for stability due to increased lateral sway especially when distracted, husband very close and attentive providing appropriate gaurding during ambulation Gait Pattern: Step-through pattern;Narrow base of support;Decreased trunk rotation;Decreased stride length Gait velocity: decreased General Gait Details: increased lateral sway Stairs: No    Exercises     PT Diagnosis: Abnormality of gait;Generalized weakness;Acute pain  PT Problem List: Decreased balance;Decreased activity tolerance;Pain;Decreased knowledge of precautions PT Treatment Interventions: Gait training;Balance training;Functional mobility training;Therapeutic activities;Therapeutic exercise;Patient/family education     PT Goals(Current goals can be found in the care plan section) Acute Rehab PT Goals Patient Stated Goal: home, no more surgeries PT Goal Formulation: With patient Time For Goal Achievement: 12/17/12 Potential to Achieve Goals: Good  Visit Information  Last PT Received On: 12/10/12 Assistance Needed: +1 History of Present Illness: 62 y/o female s/p revision L5-S1 fusion.       Prior Functioning  Home Living Family/patient expects to be discharged to:: Private residence Living Arrangements: Spouse/significant other Available Help at Discharge: Family;Available 24 hours/day Type of Home: House Home Access: Level entry Home Layout: Two level;Able to live on main level with bedroom/bathroom;Laundry or work area in Artist of Steps: 13 Home Equipment: Information systems manager -  built in Prior Function Level of Independence: Independent CommentsEngineer, manufacturing systems:  No difficulties    Cognition  Cognition Arousal/Alertness: Awake/alert Behavior During Therapy: WFL for tasks assessed/performed Overall Cognitive Status: Within Functional Limits for tasks assessed    Extremity/Trunk Assessment Upper Extremity Assessment Upper Extremity Assessment: Defer to OT evaluation Lower Extremity Assessment Lower Extremity Assessment: Generalized weakness Cervical / Trunk Assessment Cervical / Trunk Assessment: Kyphotic   Balance Balance Balance Assessed: Yes Static Standing Balance Static Standing - Balance Support: During functional activity Static Standing - Level of Assistance: 5: Stand by assistance Static Standing - Comment/# of Minutes: occasional LOB with delayed balance reactions (suspect medication)  End of Session PT - End of Session Equipment Utilized During Treatment: Gait belt Activity Tolerance: Patient tolerated treatment well Patient left: in bed;with call bell/phone within reach;with family/visitor present Nurse Communication: Mobility status  GP     [facility] HELEN 12/10/2012, 9:53 AM

## 2012-12-10 NOTE — Progress Notes (Signed)
I agree with the following treatment note after reviewing documentation.   [place], [place] Brynn   OTR/L Pager: [phone] Office: [phone] .   

## 2012-12-11 LAB — CBC WITH DIFFERENTIAL/PLATELET
Basophils Relative: 0 % (ref 0–1)
Eosinophils Absolute: 0.3 10*3/uL (ref 0.0–0.7)
Eosinophils Relative: 2 % (ref 0–5)
Hemoglobin: 11.7 g/dL — ABNORMAL LOW (ref 12.0–15.0)
Lymphs Abs: 1.4 10*3/uL (ref 0.7–4.0)
MCH: 31 pg (ref 26.0–34.0)
MCHC: 33.5 g/dL (ref 30.0–36.0)
MCV: 92.6 fL (ref 78.0–100.0)
Monocytes Relative: 10 % (ref 3–12)
Neutrophils Relative %: 77 % (ref 43–77)
Platelets: 163 10*3/uL (ref 150–400)

## 2012-12-11 MED ORDER — OXYCODONE-ACETAMINOPHEN 5-325 MG PO TABS: 1.0000 | ORAL_TABLET | ORAL | Status: DC | PRN

## 2012-12-11 MED ORDER — SODIUM CHLORIDE 0.9 % IV BOLUS (SEPSIS)
1000.0000 mL | Freq: Once | INTRAVENOUS | Status: AC
  Administered 2012-12-11: 1000 mL via INTRAVENOUS

## 2012-12-11 MED ORDER — CYCLOBENZAPRINE HCL 10 MG PO TABS: 10.0000 mg | ORAL_TABLET | Freq: Three times a day (TID) | ORAL | Status: DC | PRN

## 2012-12-11 NOTE — Discharge Summary (Signed)
  Physician Discharge Summary  Patient ID: [name] MRN: [id number] DOB/AGE: August 25, 1950 62 y.o.  Admit date: 12/09/2012 Discharge date: 12/11/2012  Admission Diagnoses: Pseudoarthrosis L5-S1  Discharge Diagnoses: Same Active Problems:   * No active hospital problems. *   Discharged Condition: good  Hospital Course: Patient admitted the hospital underwent revision of her L5-S1 fusion with iliac crest bone graft. Postoperatively patient did very well recovered in the floor on the floor she's convalescing well pain was well-controlled she was angling and voiding spontaneously she lobe without hypotension on the morning of discharge with that responded very well to hydration.  Consults: Significant Diagnostic Studies: Treatments: Revision of fusion L5-S1 with iliac crest bone graft Discharge Exam: Blood pressure 108/47, pulse 73, temperature 97.9 F (36.6 C), temperature source Oral, resp. rate 16, height 4\' 11"  (1.499 m), weight 53.116 kg (117 lb 1.6 oz), SpO2 100.00%. Strength out of 5 wound clean and dry  Disposition: Home   Future Appointments Provider Department Dept Phone   01/15/2013 11:45 AM Nyoka Cowden, MD [facility] [phone]       Medication List         budesonide-formoterol 160-4.5 MCG/ACT inhaler  Commonly known as:  SYMBICORT  Take 2 puffs first thing in am and then another 2 puffs about 12 hours later.     cyclobenzaprine 10 MG tablet  Commonly known as:  FLEXERIL  Take 1 tablet (10 mg total) by mouth 3 (three) times daily as needed for muscle spasms.     levothyroxine 88 MCG tablet  Commonly known as:  SYNTHROID, LEVOTHROID  Take 88 mcg by mouth daily.     oxyCODONE-acetaminophen 5-325 MG per tablet  Commonly known as:  PERCOCET/ROXICET  Take 1-2 tablets by mouth every 4 (four) hours as needed.     PROAIR HFA 108 (90 BASE) MCG/ACT inhaler  Generic drug:  albuterol  Inhale 2 puffs into the lungs every 6 (six) hours as needed for  wheezing.     sertraline 100 MG tablet  Commonly known as:  ZOLOFT  Take 100 mg by mouth daily.           Follow-up Information   Follow up with Clover Feehan P, MD.   Contact information:   [address] [place] [address] [phone]       Signed: Geneen Dieter P 12/11/2012, 12:11 PM

## 2012-12-11 NOTE — Progress Notes (Signed)
Read back noted that she knows  the s/s  of infection  Knows to  Wear  her brace take meds as ordered fu with MD

## 2012-12-11 NOTE — Plan of Care (Signed)
Problem: Consults Goal: Diagnosis - Spinal Surgery Thoraco/Lumbar Spine Fusion     

## 2012-12-11 NOTE — Progress Notes (Signed)
Subjective: Patient reports She's feeling well pain is well controlled  Objective: Vital signs in last 24 hours: Temp:  [97.6 F (36.4 C)-100.3 F (37.9 C)] 97.6 F (36.4 C) (07/16 0539) Pulse Rate:  [77-97] 97 (07/16 0539) Resp:  [18-20] 20 (07/16 0539) BP: (82-105)/(31-46) 87/31 mmHg (07/16 0539) SpO2:  [94 %-100 %] 98 % (07/16 0539)  Intake/Output from previous day: 07/15 0701 - 07/16 0700 In: 480 [P.O.:480] Out: 55 [Drains:55] Intake/Output this shift:    Awake alert oriented strength out of 5 wound clean and dry oh pressure a little bit on the lower and around 87 and 90 systolic  Lab Results: No results found for this basename: WBC, HGB, HCT, PLT,  in the last 72 hours BMET No results found for this basename: NA, K, CL, CO2, GLUCOSE, BUN, CREATININE, CALCIUM,  in the last 72 hours  Studies/Results: Dg Lumbar Spine 2-3 Views  12/09/2012   *RADIOLOGY REPORT*  Clinical Data: Lumbar fusion  DG C-ARM 1-60 MIN,LUMBAR SPINE - 2-3 VIEW  Comparison: 10/29/2012  Findings: Two fluoroscopic spot images from intraoperative C-arm fluoroscopy document interbody cages at the lower three interspaces.  Bilateral pedicle screws with vertical interconnecting rods, discontinuous on the left at L3-4 and on the right at L4-5.  IMPRESSION: Revision of PLIF as above   Original Report Authenticated By: D. Andria Rhein, MD   Dg C-arm 1-60 Min  12/09/2012   *RADIOLOGY REPORT*  Clinical Data: Lumbar fusion  DG C-ARM 1-60 MIN,LUMBAR SPINE - 2-3 VIEW  Comparison: 10/29/2012  Findings: Two fluoroscopic spot images from intraoperative C-arm fluoroscopy document interbody cages at the lower three interspaces.  Bilateral pedicle screws with vertical interconnecting rods, discontinuous on the left at L3-4 and on the right at L4-5.  IMPRESSION: Revision of PLIF as above   Original Report Authenticated By: D. Andria Rhein, MD    Assessment/Plan: Will bolster with liter saline we'll check CBC if she responses  saline the CBC is stable we'll discharge her later today  LOS: 2 days     Adryanna Friedt P 12/11/2012, 8:39 AM

## 2012-12-13 ENCOUNTER — Encounter: Payer: Self-pay | Admitting: Internal Medicine

## 2013-01-15 ENCOUNTER — Ambulatory Visit: Payer: BC Managed Care – PPO | Admitting: Internal Medicine

## 2013-01-16 ENCOUNTER — Encounter: Payer: Self-pay | Admitting: Internal Medicine

## 2013-01-16 ENCOUNTER — Ambulatory Visit (INDEPENDENT_AMBULATORY_CARE_PROVIDER_SITE_OTHER): Payer: BC Managed Care – PPO | Admitting: Internal Medicine

## 2013-01-16 VITALS — BP 130/70 | HR 78 | Temp 98.5°F | Ht 59.0 in | Wt 121.0 lb

## 2013-01-16 DIAGNOSIS — J45909 Unspecified asthma, uncomplicated: Secondary | ICD-10-CM

## 2013-01-16 DIAGNOSIS — F17200 Nicotine dependence, unspecified, uncomplicated: Secondary | ICD-10-CM

## 2013-01-16 DIAGNOSIS — R918 Other nonspecific abnormal finding of lung field: Secondary | ICD-10-CM

## 2013-01-16 NOTE — Patient Instructions (Addendum)
You will need a ct chest by Sept 2014 and it's ok let  Dr Lucila Maine order it have them send me the result - but if Dr Lucila Maine doesn't need to ct your spine call us to schedule it by the end of Sept 2014  The key is to stop smoking completely before smoking completely stops you!   Pulmonary follow up is as needed

## 2013-01-16 NOTE — Progress Notes (Signed)
Subjective:    Patient ID: Donna French, female    DOB: 1950-11-16  MRN: [id number]    Brief patient profile:  70 yowf smoker with chronic cough starting in her 15's referred by Dr Wynetta Emery for abn ct T spine with ? Pulmonary nodules with pft's nl p B2 c/w chronic asthma, not copd documented 01/16/13   HPI 08/01/2012 1st pulmonary eval cc chronic cough some worse x 6 months assoc with sense of pnds worse at hs and in am with mostly mucoid sputum.   rec Zyrtec is best option for drainage, try taking one at bedtime as needed The key is to stop smoking completely before smoking completely stops you!  Chantix starter pack > didn't work/ intolerant  12/04/2012 f/u ov/Donna French using saba twice daily  Chief Complaint  Patient presents with  . Followup with PFT    Pt states SOB and cough have improved some, has good days and bad days.    main issue is nasal congestion now s purulent sputum, using saba bid with no limiting sob rec Start symbicort 160 Take 2 puffs first thing in am and then another 2 puffs about 12 hours later.  Only use your albuterol (proaire) as a rescue medication Stop smoking at all costs   01/16/2013 f/u ov/ Donna French still smoking Chief Complaint  Patient presents with  . Follow-up    Breathing better since last OV.Feels symbicort helps with breathing No complaints today   not really limited from desired activities, no need for saba daytime  No obvious daytime variabilty or   cp or chest tightness, subjective wheeze overt sinus or hb symptoms. No unusual exp hx or h/o childhood pna/ asthma or premature birth to her knowledge.  Sleeping ok without nocturnal  or early am exacerbation  of respiratory  c/o's or need for noct saba. Also denies any obvious fluctuation of symptoms with weather or environmental changes or other aggravating or alleviating factors except as outlined above  Current Medications, Allergies, Past Medical History, Past Surgical History, Family History, and  Social History were reviewed in [employer] record.  ROS  The following are not active complaints unless bolded sore throat, dysphagia, dental problems, itching, sneezing,  nasal congestion or excess/ purulent secretions, ear ache,   fever, chills, sweats, unintended wt loss, pleuritic or exertional cp, hemoptysis,  orthopnea pnd or leg swelling, presyncope, palpitations, heartburn, abdominal pain, anorexia, nausea, vomiting, diarrhea  or change in bowel or urinary habits, change in stools or urine, dysuria,hematuria,  rash, arthralgias, visual complaints, headache, numbness weakness or ataxia or problems with walking or coordination,  change in mood/affect or memory.             Objective:   Physical Exam  Very pleasant amb wf nad  12/04/2012        117   > 121 01/16/2013  Wt Readings from Last 3 Encounters:  08/01/12 123 lb 6.4 oz (55.974 kg)  12/06/11 126 lb 5.2 oz (57.3 kg)  12/06/11 126 lb 5.2 oz (57.3 kg)   HEENT: nl dentition, turbinates, and orophanx. Nl external ear canals without cough reflex   NECK :  without JVD/Nodes/TM/ nl carotid upstrokes bilaterally   LUNGS: no acc muscle use, clear to A and P bilaterally without cough on insp or exp maneuvers   CV:  RRR  no s3 or murmur or increase in P2, no edema   ABD:  soft and nontender with nl excursion in the supine position. No bruits  or organomegaly, bowel sounds nl  MS:  warm without deformities, calf tenderness, cyanosis or clubbing        CXR  08/01/2012 :   COPD without acute finding.        Assessment & Plan:

## 2013-01-19 NOTE — Assessment & Plan Note (Addendum)
-   CT T Spine 06/25/12 visualization of the lungs demonstrate small  ground-glass attenuation pulmonary nodule in the right upper lobe  measuring 4 mm ( image number 15 series 5). Another tiny ground-  glass attenuation pulmonary nodules present on image number 22  series 5 in the right upper lobe. Another ground-glass attenuation  pulmonary nodule is present in the left lower lobe (image number 64  series 5 measuring 3 mm).   F/u CT chest due but may  CT spine anyway and will leave it up to Dr Lucila Maine to order > if not needed from T surg perspective can do here.  Does not need contrast from pulmonary perspective

## 2013-01-19 NOTE — Assessment & Plan Note (Signed)
Decided against taking chantix "after all she read and heard about the risks"  Discussed again those risks (small) vs those relaed to cigarettes (>50% incidents of lethal side effects) but still not persuaded

## 2013-01-19 NOTE — Assessment & Plan Note (Signed)
-   PFT's 12/04/2012 wnl p B2, a 35 % improvement  - Symbicort 160  2bid start 12/04/2012   - hfa 90% p coaching 12/04/2012   All goals of chronic asthma control met including optimal function and elimination of symptoms with minimal need for rescue therapy.  Contingencies discussed in full including contacting this office immediately if not controlling the symptoms using the rule of two's.

## 2013-01-28 ENCOUNTER — Telehealth: Payer: Self-pay | Admitting: *Deleted

## 2013-01-28 DIAGNOSIS — R918 Other nonspecific abnormal finding of lung field: Secondary | ICD-10-CM

## 2013-01-28 NOTE — Telephone Encounter (Signed)
Message copied by Christen Butter on Tue Jan 28, 2013  5:05 PM ------      Message from: Nyoka Cowden      Created: Fri Dec 13, 2012  7:05 AM       Yes, sorry, it's due 02/01/13            ----- Message -----         From: Christen Butter, CMA         Sent: 12/11/2012   5:10 PM           To: Nyoka Cowden, MD            Looking at last ov note- looks like ct was to be done 6 mo after last cxr which was in march? Is this correct? Just checking since I had an earlier than usual reminder, thanks!      ----- Message -----         From: Nyoka Cowden, MD         Sent: 08/01/2012  10:25 AM           To: Christen Butter, CMA            Needs full ct chest no contrast f/u nodules             ------

## 2013-01-28 NOTE — Telephone Encounter (Signed)
Spoke with pt and notified due for ct chest She verbalized understanding Order was sent to [facility]

## 2013-01-29 ENCOUNTER — Telehealth: Payer: Self-pay | Admitting: Internal Medicine

## 2013-01-29 NOTE — Telephone Encounter (Signed)
Fine with me to do both at once in oct 2014

## 2013-01-29 NOTE — Telephone Encounter (Signed)
Contacted patient to schedule CT Chest which is due 02/01/13 per phone note. Pt stated that she wanted this order cancelled and that she had spoke with Dr. Anastasia Pall office and he stated that since she needed a CT for her back in October that he would just order the CT Chest and she could have both of these done at the same time. I advised patient that Dr. Sherene Sires was following her for her pulmonary issues and that we could go ahead and do the CT Chest but patient stated that she would prefer to do them both in October and that Dr. Wynetta Emery would order and send Dr. Sherene Sires the report.  I advised patient that this would have to be approved by Dr. Sherene Sires. Please advise. Thanks [employer]

## 2013-01-30 ENCOUNTER — Ambulatory Visit: Payer: BC Managed Care – PPO | Admitting: Internal Medicine

## 2013-01-30 NOTE — Telephone Encounter (Signed)
Called and spoke with patient and advised her that Dr. Sherene Sires said that this was ok if Dr. Wynetta Emery wanted to order both in October 2014. Advised patient to call us and let us know when she has this done, so Dr. Sherene Sires can view the CT Chest. Pt stated that she would. Rhonda J Cobb

## 2013-03-19 ENCOUNTER — Other Ambulatory Visit: Payer: Self-pay | Admitting: Neurosurgery

## 2013-03-19 DIAGNOSIS — T84498A Other mechanical complication of other internal orthopedic devices, implants and grafts, initial encounter: Secondary | ICD-10-CM

## 2013-03-27 ENCOUNTER — Ambulatory Visit
Admission: RE | Admit: 2013-03-27 | Discharge: 2013-03-27 | Disposition: A | Discharge status: Home / Self Care | Payer: Medicare Other | Source: Ambulatory Visit | Attending: Neurosurgery | Admitting: Neurosurgery

## 2013-03-27 DIAGNOSIS — T84498A Other mechanical complication of other internal orthopedic devices, implants and grafts, initial encounter: Secondary | ICD-10-CM

## 2013-08-20 ENCOUNTER — Telehealth: Payer: Self-pay | Admitting: Internal Medicine

## 2013-08-20 NOTE — Telephone Encounter (Signed)
LMTCB

## 2013-08-20 NOTE — Telephone Encounter (Signed)
Called and spoke with pt and she stated that she has changed insurance companies and now they will not cover the symbicort but they will cover the advair.  MW please advise if ok to change the meds for the pt.  Thanks  Last ov--01/16/2013 Next ov--no pending appts  Allergies  Allergen Reactions  . Codeine Nausea And Vomiting  . Darvon [Propoxyphene Hcl] Nausea And Vomiting  . Fish Allergy Itching and Swelling  . Sulfa Antibiotics Nausea And Vomiting     Current Outpatient Prescriptions on File Prior to Visit  Medication Sig Dispense Refill  . albuterol (PROAIR HFA) 108 (90 BASE) MCG/ACT inhaler Inhale 2 puffs into the lungs every 6 (six) hours as needed for wheezing.      . budesonide-formoterol (SYMBICORT) 160-4.5 MCG/ACT inhaler Take 2 puffs first thing in am and then another 2 puffs about 12 hours later.  1 Inhaler  12  . cyclobenzaprine (FLEXERIL) 10 MG tablet Take 1 tablet (10 mg total) by mouth 3 (three) times daily as needed for muscle spasms.  80 tablet  0  . levothyroxine (SYNTHROID, LEVOTHROID) 88 MCG tablet Take 88 mcg by mouth daily.      Marland Kitchen. oxyCODONE-acetaminophen (PERCOCET/ROXICET) 5-325 MG per tablet Take 1-2 tablets by mouth every 4 (four) hours as needed.  80 tablet  0  . sertraline (ZOLOFT) 100 MG tablet Take 100 mg by mouth daily.       No current facility-administered medications on file prior to visit.

## 2013-08-20 NOTE — Telephone Encounter (Signed)
advair is not the same drug and rec instead dulera 200 2bid but if not not on formulary ok to do advari 115 2 bid

## 2013-08-21 MED ORDER — FLUTICASONE-SALMETEROL 115-21 MCG/ACT IN AERO: 2.0000 | INHALATION_SPRAY | Freq: Two times a day (BID) | RESPIRATORY_TRACT | Status: DC

## 2013-08-21 NOTE — Telephone Encounter (Signed)
Pt called back. Made her aware of recs. RX called in. Nothing further needed

## 2013-08-21 NOTE — Telephone Encounter (Signed)
LMTCBx2. Jennifer Castillo, CMA  

## 2013-10-07 ENCOUNTER — Other Ambulatory Visit: Payer: Self-pay | Admitting: Neurosurgery

## 2013-10-07 DIAGNOSIS — T84498A Other mechanical complication of other internal orthopedic devices, implants and grafts, initial encounter: Secondary | ICD-10-CM

## 2013-10-21 ENCOUNTER — Ambulatory Visit
Admission: RE | Admit: 2013-10-21 | Discharge: 2013-10-21 | Disposition: A | Discharge status: Home / Self Care | Payer: Medicare Other | Source: Ambulatory Visit | Attending: Neurosurgery | Admitting: Neurosurgery

## 2013-10-21 DIAGNOSIS — T84498A Other mechanical complication of other internal orthopedic devices, implants and grafts, initial encounter: Secondary | ICD-10-CM

## 2014-03-03 ENCOUNTER — Other Ambulatory Visit: Payer: Self-pay | Admitting: Internal Medicine

## 2014-03-09 ENCOUNTER — Other Ambulatory Visit: Payer: Self-pay | Admitting: Internal Medicine

## 2014-03-17 ENCOUNTER — Other Ambulatory Visit: Payer: Self-pay | Admitting: Neurosurgery

## 2014-03-17 DIAGNOSIS — S32009K Unspecified fracture of unspecified lumbar vertebra, subsequent encounter for fracture with nonunion: Secondary | ICD-10-CM

## 2014-03-17 DIAGNOSIS — M5021 Other cervical disc displacement,  high cervical region: Secondary | ICD-10-CM

## 2014-03-24 ENCOUNTER — Other Ambulatory Visit: Payer: Self-pay | Admitting: Neurosurgery

## 2014-03-24 ENCOUNTER — Encounter (INDEPENDENT_AMBULATORY_CARE_PROVIDER_SITE_OTHER): Payer: Self-pay

## 2014-03-24 ENCOUNTER — Ambulatory Visit
Admission: RE | Admit: 2014-03-24 | Discharge: 2014-03-24 | Disposition: A | Discharge status: Home / Self Care | Payer: Medicare Other | Source: Ambulatory Visit | Attending: Neurosurgery | Admitting: Neurosurgery

## 2014-03-24 DIAGNOSIS — S32009K Unspecified fracture of unspecified lumbar vertebra, subsequent encounter for fracture with nonunion: Secondary | ICD-10-CM

## 2014-03-24 DIAGNOSIS — M5021 Other cervical disc displacement,  high cervical region: Secondary | ICD-10-CM

## 2014-04-02 NOTE — Pre-Procedure Instructions (Signed)
Donna French  04/02/2014   Your procedure is scheduled on:  Monday, November 16th  [facility] at 530 AM.  Call this number if you have problems the morning of surgery: [phone]   Remember:   Do not eat food or drink liquids after midnight.   Take these medicines the morning of surgery with A SIP OF WATER: synthroid, zoloft, percocet if needed, inhalers   Do not wear jewelry, make-up or nail polish.  Do not wear lotions, powders, or perfume,deodorant.  Do not shave 48 hours prior to surgery. Men may shave face and neck.  Do not bring valuables to the hospital.  [facility] is not responsible for any belongings or valuables.               Contacts, dentures or bridgework may not be worn into surgery.  Leave suitcase in the car. After surgery it may be brought to your room.  For patients admitted to the hospital, discharge time is determined by your treatment team.               Patients discharged the day of surgery will not be allowed to drive home.  Please read over the following fact sheets that you were given: Pain Booklet, Coughing and Deep Breathing, Blood Transfusion Information, MRSA Information and Surgical Site Infection Prevention  [place] - Preparing for Surgery  Before surgery, you can play an important role.  Because skin is not sterile, your skin needs to be as free of germs as possible.  You can reduce the number of germs on you skin by washing with CHG (chlorahexidine gluconate) soap before surgery.  CHG is an antiseptic cleaner which kills germs and bonds with the skin to continue killing germs even after washing.  Please DO NOT use if you have an allergy to CHG or antibacterial soaps.  If your skin becomes reddened/irritated stop using the CHG and inform your nurse when you arrive at Short Stay.  Do not shave (including legs and underarms) for at least 48 hours prior to the first CHG shower.  You may shave your face.  Please  follow these instructions carefully:   1.  Shower with CHG Soap the night before surgery and the morning of Surgery.  2.  If you choose to wash your hair, wash your hair first as usual with your normal shampoo.  3.  After you shampoo, rinse your hair and body thoroughly to remove the shampoo.  4.  Use CHG as you would any other liquid soap.  You can apply CHG directly to the skin and wash gently with scrungie or a clean washcloth.  5.  Apply the CHG Soap to your body ONLY FROM THE NECK DOWN.  Do not use on open wounds or open sores.  Avoid contact with your eyes, ears, mouth and genitals (private parts).  Wash genitals (private parts) with your normal soap.  6.  Wash thoroughly, paying special attention to the area where your surgery will be performed.  7.  Thoroughly rinse your body with warm water from the neck down.  8.  DO NOT shower/wash with your normal soap after using and rinsing off the CHG Soap.  9.  Pat yourself dry with a clean towel.            10.  Wear clean pajamas.            11.  Place clean sheets on your bed the  night of your first shower and do not sleep with pets.  Day of Surgery  Do not apply any lotions/deoderants the morning of surgery.  Please wear clean clothes to the hospital/surgery center.

## 2014-04-03 ENCOUNTER — Encounter (HOSPITAL_COMMUNITY)
Admission: RE | Admit: 2014-04-03 | Discharge: 2014-04-03 | Disposition: A | Discharge status: Home / Self Care | Payer: Medicare Other | Source: Ambulatory Visit | Attending: Neurosurgery | Admitting: Neurosurgery

## 2014-04-03 ENCOUNTER — Encounter (HOSPITAL_COMMUNITY): Payer: Self-pay

## 2014-04-03 DIAGNOSIS — Z01810 Encounter for preprocedural cardiovascular examination: Secondary | ICD-10-CM | POA: Diagnosis not present

## 2014-04-03 DIAGNOSIS — Z01812 Encounter for preprocedural laboratory examination: Secondary | ICD-10-CM | POA: Insufficient documentation

## 2014-04-03 DIAGNOSIS — Y798 Miscellaneous orthopedic devices associated with adverse incidents, not elsewhere classified: Secondary | ICD-10-CM | POA: Insufficient documentation

## 2014-04-03 DIAGNOSIS — T84296A Other mechanical complication of internal fixation device of vertebrae, initial encounter: Secondary | ICD-10-CM | POA: Diagnosis not present

## 2014-04-03 LAB — TYPE AND SCREEN
ABO/RH(D): O POS
ANTIBODY SCREEN: NEGATIVE
WEAK D: POSITIVE

## 2014-04-03 LAB — CBC
HCT: 43.8 % (ref 36.0–46.0)
HEMOGLOBIN: 14.5 g/dL (ref 12.0–15.0)
MCH: 31.5 pg (ref 26.0–34.0)
MCHC: 33.1 g/dL (ref 30.0–36.0)
MCV: 95.2 fL (ref 78.0–100.0)
PLATELETS: 225 10*3/uL (ref 150–400)
RBC: 4.6 MIL/uL (ref 3.87–5.11)
RDW: 13 % (ref 11.5–15.5)
WBC: 6.9 10*3/uL (ref 4.0–10.5)

## 2014-04-03 LAB — SURGICAL PCR SCREEN
MRSA, PCR: NEGATIVE
Staphylococcus aureus: NEGATIVE

## 2014-04-03 LAB — ABO/RH: ABO/RH(D): O POS

## 2014-04-12 MED ORDER — CEFAZOLIN SODIUM-DEXTROSE 2-3 GM-% IV SOLR
2.0000 g | INTRAVENOUS | Status: AC
  Administered 2014-04-13: 2 g via INTRAVENOUS
  Filled 2014-04-12: qty 50

## 2014-04-13 ENCOUNTER — Inpatient Hospital Stay (HOSPITAL_COMMUNITY)
Admission: RE | Admit: 2014-04-13 | Discharge: 2014-04-14 | DRG: 517 | Disposition: A | Discharge status: Home / Self Care | Payer: Medicare Other | Source: Ambulatory Visit | Attending: Neurosurgery | Admitting: Neurosurgery

## 2014-04-13 ENCOUNTER — Inpatient Hospital Stay (HOSPITAL_COMMUNITY): Payer: Medicare Other | Admitting: Certified Registered"

## 2014-04-13 ENCOUNTER — Encounter (HOSPITAL_COMMUNITY)
Admission: RE | Disposition: A | Discharge status: Home / Self Care | Payer: Self-pay | Source: Ambulatory Visit | Attending: Neurosurgery

## 2014-04-13 ENCOUNTER — Encounter (HOSPITAL_COMMUNITY): Payer: Self-pay | Admitting: *Deleted

## 2014-04-13 DIAGNOSIS — F329 Major depressive disorder, single episode, unspecified: Secondary | ICD-10-CM | POA: Diagnosis present

## 2014-04-13 DIAGNOSIS — M549 Dorsalgia, unspecified: Secondary | ICD-10-CM | POA: Diagnosis present

## 2014-04-13 DIAGNOSIS — M960 Pseudarthrosis after fusion or arthrodesis: Secondary | ICD-10-CM | POA: Diagnosis present

## 2014-04-13 DIAGNOSIS — E039 Hypothyroidism, unspecified: Secondary | ICD-10-CM | POA: Diagnosis present

## 2014-04-13 DIAGNOSIS — F17210 Nicotine dependence, cigarettes, uncomplicated: Secondary | ICD-10-CM | POA: Diagnosis not present

## 2014-04-13 DIAGNOSIS — Y838 Other surgical procedures as the cause of abnormal reaction of the patient, or of later complication, without mention of misadventure at the time of the procedure: Secondary | ICD-10-CM | POA: Diagnosis not present

## 2014-04-13 DIAGNOSIS — Z981 Arthrodesis status: Secondary | ICD-10-CM

## 2014-04-13 DIAGNOSIS — S32009K Unspecified fracture of unspecified lumbar vertebra, subsequent encounter for fracture with nonunion: Secondary | ICD-10-CM | POA: Diagnosis present

## 2014-04-13 SURGERY — POSTERIOR LUMBAR FUSION 2 WITH HARDWARE REMOVAL
Anesthesia: General | Site: Back

## 2014-04-13 MED ORDER — MIDAZOLAM HCL 2 MG/2ML IJ SOLN: 0.5000 mg | Freq: Once | INTRAMUSCULAR | Status: DC | PRN

## 2014-04-13 MED ORDER — LIDOCAINE-EPINEPHRINE 1 %-1:100000 IJ SOLN
INTRAMUSCULAR | Status: DC | PRN
  Administered 2014-04-13: 10 mL

## 2014-04-13 MED ORDER — DIAZEPAM 5 MG PO TABS: 5.0000 mg | ORAL_TABLET | Freq: Four times a day (QID) | ORAL | Status: DC | PRN

## 2014-04-13 MED ORDER — PROPOFOL 10 MG/ML IV BOLUS
INTRAVENOUS | Status: DC | PRN
  Administered 2014-04-13: 150 mg via INTRAVENOUS

## 2014-04-13 MED ORDER — MENTHOL 3 MG MT LOZG: 1.0000 | LOZENGE | OROMUCOSAL | Status: DC | PRN

## 2014-04-13 MED ORDER — PROPOFOL 10 MG/ML IV BOLUS
INTRAVENOUS | Status: AC
  Filled 2014-04-13: qty 20

## 2014-04-13 MED ORDER — SERTRALINE HCL 100 MG PO TABS
100.0000 mg | ORAL_TABLET | Freq: Every day | ORAL | Status: DC
  Filled 2014-04-13: qty 1

## 2014-04-13 MED ORDER — SODIUM CHLORIDE 0.9 % IJ SOLN
INTRAMUSCULAR | Status: AC
  Filled 2014-04-13: qty 10

## 2014-04-13 MED ORDER — ACETAMINOPHEN 650 MG RE SUPP: 650.0000 mg | RECTAL | Status: DC | PRN

## 2014-04-13 MED ORDER — ROCURONIUM BROMIDE 100 MG/10ML IV SOLN
INTRAVENOUS | Status: DC | PRN
  Administered 2014-04-13: 10 mg via INTRAVENOUS
  Administered 2014-04-13: 40 mg via INTRAVENOUS

## 2014-04-13 MED ORDER — ONDANSETRON HCL 4 MG/2ML IJ SOLN
INTRAMUSCULAR | Status: AC
  Filled 2014-04-13: qty 2

## 2014-04-13 MED ORDER — OXYCODONE-ACETAMINOPHEN 5-325 MG PO TABS
1.0000 | ORAL_TABLET | ORAL | Status: DC | PRN
  Administered 2014-04-13 (×2): 2 via ORAL
  Filled 2014-04-13 (×2): qty 2

## 2014-04-13 MED ORDER — ONDANSETRON HCL 4 MG/2ML IJ SOLN
INTRAMUSCULAR | Status: DC | PRN
  Administered 2014-04-13: 4 mg via INTRAVENOUS

## 2014-04-13 MED ORDER — SODIUM CHLORIDE 0.9 % IR SOLN
Status: DC | PRN
  Administered 2014-04-13: 07:00:00

## 2014-04-13 MED ORDER — FENTANYL CITRATE 0.05 MG/ML IJ SOLN
INTRAMUSCULAR | Status: DC | PRN
  Administered 2014-04-13: 150 ug via INTRAVENOUS
  Administered 2014-04-13: 100 ug via INTRAVENOUS

## 2014-04-13 MED ORDER — SODIUM CHLORIDE 0.9 % IJ SOLN
3.0000 mL | Freq: Two times a day (BID) | INTRAMUSCULAR | Status: DC
  Administered 2014-04-13: 3 mL via INTRAVENOUS

## 2014-04-13 MED ORDER — HYDROMORPHONE HCL 1 MG/ML IJ SOLN: 0.2500 mg | INTRAMUSCULAR | Status: DC | PRN

## 2014-04-13 MED ORDER — ROCURONIUM BROMIDE 50 MG/5ML IV SOLN
INTRAVENOUS | Status: AC
  Filled 2014-04-13: qty 1

## 2014-04-13 MED ORDER — ARTIFICIAL TEARS OP OINT
TOPICAL_OINTMENT | OPHTHALMIC | Status: DC | PRN
  Administered 2014-04-13: 1 via OPHTHALMIC

## 2014-04-13 MED ORDER — ONDANSETRON HCL 4 MG/2ML IJ SOLN: 4.0000 mg | INTRAMUSCULAR | Status: DC | PRN

## 2014-04-13 MED ORDER — SODIUM CHLORIDE 0.9 % IJ SOLN: 3.0000 mL | INTRAMUSCULAR | Status: DC | PRN

## 2014-04-13 MED ORDER — THROMBIN 20000 UNITS EX SOLR
CUTANEOUS | Status: DC | PRN
  Administered 2014-04-13: 07:00:00 via TOPICAL

## 2014-04-13 MED ORDER — OXYCODONE HCL 5 MG/5ML PO SOLN: 5.0000 mg | Freq: Once | ORAL | Status: DC | PRN

## 2014-04-13 MED ORDER — PHENYLEPHRINE HCL 10 MG/ML IJ SOLN
10.0000 mg | INTRAVENOUS | Status: DC | PRN
  Administered 2014-04-13: 15 ug/min via INTRAVENOUS

## 2014-04-13 MED ORDER — LEVOTHYROXINE SODIUM 88 MCG PO TABS
88.0000 ug | ORAL_TABLET | Freq: Every day | ORAL | Status: DC
  Administered 2014-04-14: 88 ug via ORAL
  Filled 2014-04-13 (×2): qty 1

## 2014-04-13 MED ORDER — MIDAZOLAM HCL 2 MG/2ML IJ SOLN
INTRAMUSCULAR | Status: AC
  Filled 2014-04-13: qty 2

## 2014-04-13 MED ORDER — CEFAZOLIN SODIUM 1-5 GM-% IV SOLN
1.0000 g | Freq: Three times a day (TID) | INTRAVENOUS | Status: AC
  Administered 2014-04-13 – 2014-04-14 (×2): 1 g via INTRAVENOUS
  Filled 2014-04-13 (×2): qty 50

## 2014-04-13 MED ORDER — ACETAMINOPHEN 325 MG PO TABS: 650.0000 mg | ORAL_TABLET | ORAL | Status: DC | PRN

## 2014-04-13 MED ORDER — GLYCOPYRROLATE 0.2 MG/ML IJ SOLN
INTRAMUSCULAR | Status: DC | PRN
  Administered 2014-04-13: 0.4 mg via INTRAVENOUS

## 2014-04-13 MED ORDER — MIDAZOLAM HCL 5 MG/5ML IJ SOLN
INTRAMUSCULAR | Status: DC | PRN
  Administered 2014-04-13: 2 mg via INTRAVENOUS

## 2014-04-13 MED ORDER — PHENYLEPHRINE HCL 10 MG/ML IJ SOLN
INTRAMUSCULAR | Status: DC | PRN
  Administered 2014-04-13 (×3): 80 ug via INTRAVENOUS

## 2014-04-13 MED ORDER — PHENOL 1.4 % MT LIQD: 1.0000 | OROMUCOSAL | Status: DC | PRN

## 2014-04-13 MED ORDER — ALUM & MAG HYDROXIDE-SIMETH 200-200-20 MG/5ML PO SUSP: 30.0000 mL | Freq: Four times a day (QID) | ORAL | Status: DC | PRN

## 2014-04-13 MED ORDER — SODIUM CHLORIDE 0.9 % IV SOLN: 250.0000 mL | INTRAVENOUS | Status: DC

## 2014-04-13 MED ORDER — DIPHENHYDRAMINE HCL 50 MG/ML IJ SOLN
10.0000 mg | Freq: Once | INTRAMUSCULAR | Status: AC
  Administered 2014-04-13: 10 mg via INTRAVENOUS

## 2014-04-13 MED ORDER — DIPHENHYDRAMINE HCL 50 MG/ML IJ SOLN
INTRAMUSCULAR | Status: AC
  Filled 2014-04-13: qty 1

## 2014-04-13 MED ORDER — 0.9 % SODIUM CHLORIDE (POUR BTL) OPTIME
TOPICAL | Status: DC | PRN
  Administered 2014-04-13: 1000 mL

## 2014-04-13 MED ORDER — GLYCOPYRROLATE 0.2 MG/ML IJ SOLN
INTRAMUSCULAR | Status: AC
  Filled 2014-04-13: qty 2

## 2014-04-13 MED ORDER — EPHEDRINE SULFATE 50 MG/ML IJ SOLN
INTRAMUSCULAR | Status: AC
  Filled 2014-04-13: qty 1

## 2014-04-13 MED ORDER — HYDROMORPHONE HCL 1 MG/ML IJ SOLN: 0.5000 mg | INTRAMUSCULAR | Status: DC | PRN

## 2014-04-13 MED ORDER — MEPERIDINE HCL 25 MG/ML IJ SOLN: 6.2500 mg | INTRAMUSCULAR | Status: DC | PRN

## 2014-04-13 MED ORDER — LIDOCAINE HCL (CARDIAC) 20 MG/ML IV SOLN
INTRAVENOUS | Status: AC
  Filled 2014-04-13: qty 5

## 2014-04-13 MED ORDER — NEOSTIGMINE METHYLSULFATE 10 MG/10ML IV SOLN
INTRAVENOUS | Status: DC | PRN
  Administered 2014-04-13: 3 mg via INTRAVENOUS

## 2014-04-13 MED ORDER — LACTATED RINGERS IV SOLN
INTRAVENOUS | Status: DC | PRN
  Administered 2014-04-13 (×2): via INTRAVENOUS

## 2014-04-13 MED ORDER — DEXAMETHASONE SODIUM PHOSPHATE 10 MG/ML IJ SOLN
10.0000 mg | INTRAMUSCULAR | Status: AC
  Administered 2014-04-13: 10 mg via INTRAVENOUS
  Filled 2014-04-13: qty 1

## 2014-04-13 MED ORDER — PROMETHAZINE HCL 25 MG/ML IJ SOLN: 6.2500 mg | INTRAMUSCULAR | Status: DC | PRN

## 2014-04-13 MED ORDER — NEOSTIGMINE METHYLSULFATE 10 MG/10ML IV SOLN
INTRAVENOUS | Status: AC
  Filled 2014-04-13: qty 1

## 2014-04-13 MED ORDER — ALBUTEROL SULFATE (2.5 MG/3ML) 0.083% IN NEBU: 3.0000 mL | INHALATION_SOLUTION | Freq: Four times a day (QID) | RESPIRATORY_TRACT | Status: DC | PRN

## 2014-04-13 MED ORDER — OXYCODONE HCL 5 MG PO TABS: 5.0000 mg | ORAL_TABLET | Freq: Once | ORAL | Status: DC | PRN

## 2014-04-13 MED ORDER — SUCCINYLCHOLINE CHLORIDE 20 MG/ML IJ SOLN
INTRAMUSCULAR | Status: AC
  Filled 2014-04-13: qty 1

## 2014-04-13 MED ORDER — EPHEDRINE SULFATE 50 MG/ML IJ SOLN
INTRAMUSCULAR | Status: DC | PRN
  Administered 2014-04-13: 15 mg via INTRAVENOUS
  Administered 2014-04-13: 10 mg via INTRAVENOUS

## 2014-04-13 MED ORDER — FENTANYL CITRATE 0.05 MG/ML IJ SOLN
INTRAMUSCULAR | Status: AC
  Filled 2014-04-13: qty 5

## 2014-04-13 MED ORDER — INFLUENZA VAC SPLIT QUAD 0.5 ML IM SUSY
0.5000 mL | PREFILLED_SYRINGE | INTRAMUSCULAR | Status: AC
  Administered 2014-04-14: 0.5 mL via INTRAMUSCULAR
  Filled 2014-04-13 (×2): qty 0.5

## 2014-04-13 MED ORDER — ARTIFICIAL TEARS OP OINT
TOPICAL_OINTMENT | OPHTHALMIC | Status: AC
  Filled 2014-04-13: qty 3.5

## 2014-04-13 MED ORDER — LIDOCAINE HCL (CARDIAC) 20 MG/ML IV SOLN
INTRAVENOUS | Status: DC | PRN
  Administered 2014-04-13: 30 mg via INTRAVENOUS

## 2014-04-13 SURGICAL SUPPLY — 68 items
ALLOSTEM STRIP 20MMX25MM (Tissue) ×3 IMPLANT
BAG DECANTER FOR FLEXI CONT (MISCELLANEOUS) ×3 IMPLANT
BENZOIN TINCTURE PRP APPL 2/3 (GAUZE/BANDAGES/DRESSINGS) ×3 IMPLANT
BLADE CLIPPER SURG (BLADE) IMPLANT
BLADE SURG 11 STRL SS (BLADE) ×3 IMPLANT
BONE ALLOSTEM MORSELIZED 5CC (Bone Implant) ×3 IMPLANT
BRUSH SCRUB EZ PLAIN DRY (MISCELLANEOUS) ×3 IMPLANT
BUR MATCHSTICK NEURO 3.0 LAGG (BURR) ×3 IMPLANT
BUR PRECISION FLUTE 6.0 (BURR) ×3 IMPLANT
CANISTER SUCT 3000ML (MISCELLANEOUS) ×3 IMPLANT
CLOSURE WOUND 1/2 X4 (GAUZE/BANDAGES/DRESSINGS) ×1
CONT SPEC 4OZ CLIKSEAL STRL BL (MISCELLANEOUS) ×6 IMPLANT
COVER BACK TABLE 24X17X13 BIG (DRAPES) IMPLANT
COVER BACK TABLE 60X90IN (DRAPES) ×3 IMPLANT
DECANTER SPIKE VIAL GLASS SM (MISCELLANEOUS) ×3 IMPLANT
DRAPE C-ARM 42X72 X-RAY (DRAPES) ×6 IMPLANT
DRAPE LAPAROTOMY 100X72X124 (DRAPES) ×3 IMPLANT
DRAPE POUCH INSTRU U-SHP 10X18 (DRAPES) ×3 IMPLANT
DRAPE PROXIMA HALF (DRAPES) IMPLANT
DRAPE SURG 17X23 STRL (DRAPES) ×3 IMPLANT
DRSG OPSITE 4X5.5 SM (GAUZE/BANDAGES/DRESSINGS) ×3 IMPLANT
DRSG OPSITE POSTOP 4X6 (GAUZE/BANDAGES/DRESSINGS) ×3 IMPLANT
DURAPREP 26ML APPLICATOR (WOUND CARE) ×3 IMPLANT
ELECT REM PT RETURN 9FT ADLT (ELECTROSURGICAL) ×3
ELECTRODE REM PT RTRN 9FT ADLT (ELECTROSURGICAL) ×1 IMPLANT
EVACUATOR 3/16  PVC DRAIN (DRAIN) ×2
EVACUATOR 3/16 PVC DRAIN (DRAIN) ×1 IMPLANT
GAUZE SPONGE 4X4 12PLY STRL (GAUZE/BANDAGES/DRESSINGS) ×3 IMPLANT
GAUZE SPONGE 4X4 16PLY XRAY LF (GAUZE/BANDAGES/DRESSINGS) IMPLANT
GLOVE BIO SURGEON STRL SZ 6.5 (GLOVE) ×2 IMPLANT
GLOVE BIO SURGEON STRL SZ8 (GLOVE) ×6 IMPLANT
GLOVE BIO SURGEON STRL SZ8.5 (GLOVE) ×3 IMPLANT
GLOVE BIO SURGEONS STRL SZ 6.5 (GLOVE) ×1
GLOVE BIOGEL PI IND STRL 7.0 (GLOVE) ×3 IMPLANT
GLOVE BIOGEL PI INDICATOR 7.0 (GLOVE) ×6
GLOVE EXAM NITRILE LRG STRL (GLOVE) IMPLANT
GLOVE EXAM NITRILE MD LF STRL (GLOVE) IMPLANT
GLOVE EXAM NITRILE XL STR (GLOVE) IMPLANT
GLOVE EXAM NITRILE XS STR PU (GLOVE) IMPLANT
GLOVE INDICATOR 7.5 STRL GRN (GLOVE) ×6 IMPLANT
GLOVE INDICATOR 8.5 STRL (GLOVE) ×6 IMPLANT
GOWN STRL REUS W/ TWL LRG LVL3 (GOWN DISPOSABLE) IMPLANT
GOWN STRL REUS W/ TWL XL LVL3 (GOWN DISPOSABLE) ×2 IMPLANT
GOWN STRL REUS W/TWL 2XL LVL3 (GOWN DISPOSABLE) IMPLANT
GOWN STRL REUS W/TWL LRG LVL3 (GOWN DISPOSABLE)
GOWN STRL REUS W/TWL XL LVL3 (GOWN DISPOSABLE) ×4
KIT BASIN OR (CUSTOM PROCEDURE TRAY) ×3 IMPLANT
KIT INFUSE X SMALL 1.4CC (Orthopedic Implant) ×3 IMPLANT
KIT ROOM TURNOVER OR (KITS) ×3 IMPLANT
LIQUID BAND (GAUZE/BANDAGES/DRESSINGS) ×3 IMPLANT
NEEDLE HYPO 25X1 1.5 SAFETY (NEEDLE) ×3 IMPLANT
NS IRRIG 1000ML POUR BTL (IV SOLUTION) ×3 IMPLANT
PACK LAMINECTOMY NEURO (CUSTOM PROCEDURE TRAY) ×3 IMPLANT
PAD ARMBOARD 7.5X6 YLW CONV (MISCELLANEOUS) ×9 IMPLANT
PUTTY BONE DBX 5CC MIX (Putty) ×3 IMPLANT
SPONGE LAP 4X18 X RAY DECT (DISPOSABLE) IMPLANT
SPONGE SURGIFOAM ABS GEL 100 (HEMOSTASIS) ×3 IMPLANT
STRIP BIOACTIVE 5CC 25X50X4MM (Miscellaneous) ×3 IMPLANT
STRIP CLOSURE SKIN 1/2X4 (GAUZE/BANDAGES/DRESSINGS) ×2 IMPLANT
SUT VIC AB 0 CT1 18XCR BRD8 (SUTURE) ×1 IMPLANT
SUT VIC AB 0 CT1 8-18 (SUTURE) ×2
SUT VIC AB 2-0 CT1 18 (SUTURE) ×6 IMPLANT
SUT VICRYL 4-0 PS2 18IN ABS (SUTURE) ×3 IMPLANT
SYR 20ML ECCENTRIC (SYRINGE) ×3 IMPLANT
TOWEL OR 17X24 6PK STRL BLUE (TOWEL DISPOSABLE) ×3 IMPLANT
TOWEL OR 17X26 10 PK STRL BLUE (TOWEL DISPOSABLE) ×3 IMPLANT
TRAY FOLEY CATH 14FRSI W/METER (CATHETERS) ×3 IMPLANT
WATER STERILE IRR 1000ML POUR (IV SOLUTION) ×3 IMPLANT

## 2014-04-13 NOTE — Anesthesia Postprocedure Evaluation (Signed)
  Anesthesia Post-op Note  Patient: Donna French  Procedure(s) Performed: Procedure(s): posterior lateral fusion removal of hardware  lumbar four-five lumbar five-sacral one (N/A)  Patient Location: PACU  Anesthesia Type:General  Level of Consciousness: awake, alert , oriented and patient cooperative  Airway and Oxygen Therapy: Patient Spontanous Breathing  Post-op Pain: none  Post-op Assessment: Post-op Vital signs reviewed, Patient's Cardiovascular Status Stable, Respiratory Function Stable, Patent Airway, No signs of Nausea or vomiting and Pain level controlled  Post-op Vital Signs: Reviewed and stable  Last Vitals:  Filed Vitals:   04/13/14 1040  BP: 102/46  Pulse: 72  Temp: 36.5 C  Resp: 16    Complications: No apparent anesthesia complications

## 2014-04-13 NOTE — Anesthesia Procedure Notes (Signed)
Procedure Name: Intubation Date/Time: 04/13/2014 7:31 AM Performed by: De NurseENNIE, Leslie Langille E Pre-anesthesia Checklist: Patient identified, Emergency Drugs available, Suction available, Patient being monitored and Timeout performed Patient Re-evaluated:Patient Re-evaluated prior to inductionOxygen Delivery Method: Circle system utilized Preoxygenation: Pre-oxygenation with 100% oxygen Intubation Type: IV induction Ventilation: Mask ventilation without difficulty Laryngoscope Size: Mac and 3 Grade View: Grade II Tube type: Oral Tube size: 7.0 mm Number of attempts: 1 Airway Equipment and Method: Stylet Placement Confirmation: ETT inserted through vocal cords under direct vision,  positive ETCO2 and breath sounds checked- equal and bilateral Secured at: 21 cm Tube secured with: Tape Dental Injury: Teeth and Oropharynx as per pre-operative assessment

## 2014-04-13 NOTE — Progress Notes (Signed)
OT Cancellation Note  Patient Details Name: Norwood LevoLinda S Mcdaris MRN: [id number] DOB: Oct 26, 1950   Cancelled Treatment:    Reason Eval/Treat Not Completed: OT screened, no needs identified, will sign off. Spoke with pt and she has no OT concerns and has had previous back surgeries. Will sign off.   Earlie RavelingStraub, Valeree Leidy L OTR/L [phone] 04/13/2014, 2:39 PM

## 2014-04-13 NOTE — Anesthesia Preprocedure Evaluation (Addendum)
Anesthesia Evaluation  Patient identified by MRN, date of birth, ID band Patient awake    Reviewed: Allergy & Precautions, H&P , NPO status , Patient's Chart, lab work & pertinent test results  History of Anesthesia Complications Negative for: history of anesthetic complications  Airway Mallampati: I  TM Distance: >3 FB Neck ROM: Full    Dental  (+) Teeth Intact, Dental Advisory Given   Pulmonary asthma , COPDCurrent Smoker,  breath sounds clear to auscultation        Cardiovascular - anginanegative cardio ROS  Rhythm:Regular Rate:Normal     Neuro/Psych Depression Chronic low back pain    GI/Hepatic negative GI ROS, Neg liver ROS,   Endo/Other  Hypothyroidism   Renal/GU negative Renal ROS     Musculoskeletal   Abdominal (+) + obese,   Peds  Hematology negative hematology ROS (+)   Anesthesia Other Findings   Reproductive/Obstetrics                          Anesthesia Physical Anesthesia Plan  ASA: II  Anesthesia Plan: General   Post-op Pain Management:    Induction: Intravenous  Airway Management Planned: Oral ETT  Additional Equipment:   Intra-op Plan:   Post-operative Plan: Extubation in OR  Informed Consent: I have reviewed the patients History and Physical, chart, labs and discussed the procedure including the risks, benefits and alternatives for the proposed anesthesia with the patient or authorized representative who has indicated his/her understanding and acceptance.   Dental advisory given  Plan Discussed with: Surgeon and CRNA  Anesthesia Plan Comments: (Plan routine monitors, GETA)        Anesthesia Quick Evaluation

## 2014-04-13 NOTE — Plan of Care (Signed)
Problem: Consults Goal: Spinal Surgery Patient Education See Patient Education Module for education specifics.  Outcome: Completed/Met Date Met:  04/13/14 Goal: Diagnosis - Spinal Surgery Outcome: Completed/Met Date Met:  04/13/14 Spinal Fusion Redo

## 2014-04-13 NOTE — Progress Notes (Signed)
PT Cancellation Note  Patient Details Name: Donna French MRN: [id number] DOB: Jan 18, 1951   Cancelled Treatment:    Reason Eval/Treat Not Completed: PT screened, no needs identified, will sign off.  Pt with prior back surgeries.  Will sign off.  Thanks.   [name], [name] 04/13/2014, 2:59 PM [employer] [place] Monds,PT Acute Rehabilitation [phone] (pager)

## 2014-04-13 NOTE — Op Note (Signed)
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  EBL: Minimal  History of present illness: Patient is a 63 year old female had undergone previous brachial lumbar fusion underwent are sustained a pseudoarthrosis at L5-S1 underwent revision surgery with replacement of her S1 screws and redo posterior lateral arthrodesis that did not take and she has was left with a persistent pseudoarthrosis and loosening of her S1 screws. Due to patient's persistent back pain and evidence of at least partial fusion CT scan I recommended the patient expiration of fusion removal of hardware and possible redo posterior lateral fusion non-instrumented. I extensively went over the risks and benefits of the operation with the patient as well as perioperative course expectations of outcome and alternatives of surgery and she understands and agrees to proceed forward.  Operative procedure: Patient brought into the or was induced under general anesthesia positioned prone the Wilson frame her back was prepped and draped in routine sterile fashion her old incision was infiltrated with 5 mL lidocaine with epi a midline incision was made and Bovie left car was used to calcification subperiosteal dissections were carried out exposing the hardware bilaterally L4-S1 on the left L5-S1 the right. The nuts were then removed the rods removed and the screws were all removed L4 to S1 left L5-S1 the right. I then proceeded to expose through islands of bone that were not fused to her native vertebra. Identified the sacral ala bilaterally the L5 TP on the right and the L4 and L5 TPs of the left. Again there was islands of solid  bone above these had not incorporated into the TPs or sacral Ala laterally on either side.after copious irrigation aggressive decorticated along the TPs and sacral ala laid the allograft morsels strips and BMP from L4 to the sacrum on the left and L5 to the sacrum on the right placing the morsels directly overlying the bone followed by a BMP sponge followed by the strips. Placed a medium Hemovac drain closed the wound with interrupted Vicryl and a running 4 subcuticular in the skin Dermabond benzo and Steri-Strips were all applied wound was dressed and patient went covered in stable condition. At the end of case all needle counts sponge counts were correct.

## 2014-04-13 NOTE — Transfer of Care (Signed)
Immediate Anesthesia Transfer of Care Note  Patient: Donna LevoLinda S French  Procedure(s) Performed: Procedure(s): posterior lateral fusion removal of hardware  lumbar four-five lumbar five-sacral one (N/A)  Patient Location: PACU  Anesthesia Type:General  Level of Consciousness: lethargic and responds to stimulation  Airway & Oxygen Therapy: Patient Spontanous Breathing and Patient connected to nasal cannula oxygen  Post-op Assessment: Report given to PACU RN  Post vital signs: Reviewed and stable  Complications: No apparent anesthesia complications

## 2014-04-13 NOTE — Progress Notes (Signed)
Utilization review completed.  

## 2014-04-13 NOTE — H&P (Signed)
Donna French is an 63 y.o. female.   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 Patient will undergone a previous revision for pseudoarthrosis however still had progressive loosening of her S1 screws. It did appear that she had some posterior lateral bone and at least a partial solid fusion at L5-S1. But due to her failure second time of revision for pseudoarthrosis I recommended removal of hardware evaluation of her fusion possible redo posterior lateral fusion non-instrumented. I've extensively gone over the risks and benefits of this operation with the patient as well as perioperative course expectations of outcome and alternatives of surgery she understands and agrees to proceed forward.  Past Medical History  Diagnosis Date  . Hypothyroidism   . Depression   . Asthma     Hx: of  . Pneumonia     Hx: of  . Arthritis     Hx: of T/O    Past Surgical History  Procedure Laterality Date  . Cervical fusion  x2  . Tonsillectomy    . Appendectomy    . Abdominal hysterectomy    . Cholecystectomy    . Breast surgery      lumpectomy  . Lumbar laminectomy/decompression microdiscectomy    . Diagnostic laparoscopy      Hx: of exploratory Lap  . Dilation and curettage of uterus    . Lumbar fusion  12/09/2012    Family History  Problem Relation Age of Onset  . Allergies Brother   . Allergies Mother   . Asthma Mother   . Asthma Sister   . Heart disease Brother   . Lung cancer Maternal Uncle     was a smoker   Social History:  reports that she has been smoking Cigarettes.  She has a 26.5 pack-year smoking history. She has never used smokeless tobacco. She  reports that she does not drink alcohol or use illicit drugs.  Allergies:  Allergies  Allergen Reactions  . Codeine Nausea And Vomiting  . Darvon [Propoxyphene Hcl] Nausea And Vomiting  . Fish Allergy Itching and Swelling  . Sulfa Antibiotics Nausea And Vomiting    Medications Prior to Admission  Medication Sig Dispense Refill  . albuterol (PROAIR HFA) 108 (90 BASE) MCG/ACT inhaler Inhale 2 puffs into the lungs every 6 (six) hours as needed for wheezing.    Marland Kitchen. levothyroxine (SYNTHROID, LEVOTHROID) 88 MCG tablet Take 88 mcg by mouth daily.    . sertraline (ZOLOFT) 100 MG tablet Take 100 mg by mouth daily.    Marland Kitchen. ADVAIR HFA 115-21 MCG/ACT inhaler INHALE 2 PUFFS INTO THE LUNGS 2 (TWO) TIMES DAILY. (Patient not taking: Reported on 03/31/2014) 1 Inhaler 0  . budesonide-formoterol (SYMBICORT) 160-4.5 MCG/ACT inhaler Take 2 puffs first thing in am and then another 2 puffs about 12 hours later. 1 Inhaler 12  . cyclobenzaprine (FLEXERIL) 10 MG tablet Take 1 tablet (10 mg total) by mouth 3 (three) times daily as needed for muscle spasms. 80 tablet 0  . oxyCODONE-acetaminophen (PERCOCET/ROXICET) 5-325 MG per tablet Take 1-2 tablets by mouth every 4 (four) hours as needed. 80 tablet 0    No results found for this or any previous visit (from the past 48 hour(s)).  No results found.  Review of Systems  Constitutional: Negative.   HENT: Negative.   Eyes: Negative.   Respiratory: Negative.   Cardiovascular: Negative.   Gastrointestinal: Negative.   Genitourinary: Negative.   Musculoskeletal: Positive for myalgias and back pain.  Skin: Negative.   Neurological: Negative.   Endo/Heme/Allergies: Negative.   Psychiatric/Behavioral: Negative.     Blood pressure 108/54, pulse 64, temperature 97.5 F (36.4 C), temperature source Oral, resp. rate 20, height 4\' 11"  (1.499 m), weight 54.602 kg (120 lb 6 oz), SpO2 99 %. Physical Exam  Constitutional: She is oriented to person, place, and time. She appears  well-developed and well-nourished.  HENT:  Head: Normocephalic.  Eyes: Pupils are equal, round, and reactive to light.  Neck: Normal range of motion.  Cardiovascular: Normal rate.   Respiratory: Effort normal.  GI: Soft. Bowel sounds are normal.  Neurological: She is alert and oriented to person, place, and time. She has normal strength. GCS eye subscore is 4. GCS verbal subscore is 5. GCS motor subscore is 6.  Strength is 5 out of 5 in her iliopsoas, quads, hamstrings, gastrocs, and tibialis, and EHL.  Skin: Skin is warm and dry.     Assessment/Plan 63 year old presents for exploration of fusion removal of hardware and redo posterior lateral fusion.  Shandra Szymborski P 04/13/2014, 7:16 AM

## 2014-04-14 DIAGNOSIS — M960 Pseudarthrosis after fusion or arthrodesis: Secondary | ICD-10-CM | POA: Diagnosis not present

## 2014-04-14 MED ORDER — OXYCODONE-ACETAMINOPHEN 5-325 MG PO TABS: 1.0000 | ORAL_TABLET | ORAL | Status: DC | PRN

## 2014-04-14 NOTE — Progress Notes (Signed)
Pt doing well. Pt given D/C instructions with Rx's, verbal understanding was provided. Pt's Hemovac was removed by Dr. Wynetta Emeryram prior to D/C. Pt's IV was removed prior to D/C. Pt's incision is clean and dry with no sign of infection. Pt D/C'd home via walking @ 0810 per MD order. Pt is stable @ D/C and has no other needs at this time. Rema FendtAshley Bird Swetz, RN

## 2014-04-14 NOTE — Discharge Summary (Signed)
  Physician Discharge Summary  Patient ID: Donna French MRN: [id number] DOB/AGE: 65-Mar-1952 65 y.o.  Admit date: 04/13/2014 Discharge date: 04/14/2014  Admission Diagnoses:pseudoarthrosis L5-S1 and L4-5 on the left  Discharge Diagnoses: same Active Problems:   Pseudoarthrosis of lumbar spine   Discharged Condition: good  Hospital Course: this is been hospital underwent revision of fusion postoperative patient did very well recovered for [place] resembling voiding spontaneously tolerating regular diet was stable for discharge home.  Consults: Significant Diagnostic Studies: Treatments:revision of fusion L4-S1 left L5-S1 right Discharge Exam: Blood pressure 128/56, pulse 73, temperature 98.2 F (36.8 C), temperature source Oral, resp. rate 18, height 4\' 11"  (1.499 m), weight 54.602 kg (120 lb 6 oz), SpO2 95 %. Strength out of 5 wound clean dry and intact  Disposition: home     Medication List    TAKE these medications        ADVAIR HFA 115-21 MCG/ACT inhaler  Generic drug:  fluticasone-salmeterol  INHALE 2 PUFFS INTO THE LUNGS 2 (TWO) TIMES DAILY.     budesonide-formoterol 160-4.5 MCG/ACT inhaler  Commonly known as:  SYMBICORT  Take 2 puffs first thing in am and then another 2 puffs about 12 hours later.     cyclobenzaprine 10 MG tablet  Commonly known as:  FLEXERIL  Take 1 tablet (10 mg total) by mouth 3 (three) times daily as needed for muscle spasms.     levothyroxine 88 MCG tablet  Commonly known as:  SYNTHROID, LEVOTHROID  Take 88 mcg by mouth daily.     oxyCODONE-acetaminophen 5-325 MG per tablet  Commonly known as:  PERCOCET/ROXICET  Take 1-2 tablets by mouth every 4 (four) hours as needed.     oxyCODONE-acetaminophen 5-325 MG per tablet  Commonly known as:  PERCOCET/ROXICET  Take 1-2 tablets by mouth every 4 (four) hours as needed for moderate pain.     PROAIR HFA 108 (90 BASE) MCG/ACT inhaler  Generic drug:  albuterol  Inhale 2 puffs into the  lungs every 6 (six) hours as needed for wheezing.     sertraline 100 MG tablet  Commonly known as:  ZOLOFT  Take 100 mg by mouth daily.           Follow-up Information    [facility]Albana Saperstein P, MD.   Specialty:  Neurosurgery   Contact information:   [address] [place] [address] [phone]       Signed: Iva Posten P 04/14/2014, 7:45 AM

## 2014-04-14 NOTE — Discharge Instructions (Signed)
No lifting no bending no twisting no driving a riding a car unless she has come back and forth to see me.   Wound Care Keep incision covered and dry for one week.  If you shower prior to then, cover incision with plastic wrap.  You may remove outer bandage after one week and shower.  Do not put any creams, lotions, or ointments on incision. Leave steri-strips on neck.  They will fall off by themselves. Activity Walk each and every day, increasing distance each day. No lifting greater than 5 lbs.  Avoid bending, arching, or twisting. No driving for 2 weeks; may ride as a passenger locally. If provided with back brace, wear when out of bed.  It is not necessary to wear in bed. Diet Resume your normal diet.  Return to Work Will be discussed at you follow up appointment. Call Your Doctor If Any of These Occur Redness, drainage, or swelling at the wound.  Temperature greater than 101 degrees. Severe pain not relieved by pain medication. Incision starts to come apart. Follow Up Appt Call today for appointment in 1-2 weeks [phone]) or for problems.  If you have any hardware placed in your spine, you will need an x-ray before your appointment.

## 2014-04-14 NOTE — Progress Notes (Signed)
Patient ID: Donna French, female   DOB: December 09, 1950, 63 y.o.   MRN: [id number] Doing great no leg pain minimal back pain  Strength out of 5 wound clean dry and intact  Discharge home

## 2014-07-08 IMAGING — CT CT T SPINE W/O CM
4 of 5 series · 16 of 33 positions shown, 18 images · non-contrast
Comparison: None.

CLINICAL DATA: Preoperative study for patient with scoliosis.
Planned fusion.

CT THORACIC SPINE WITHOUT CONTRAST
TECHNIQUE: Multidetector CT imaging of the thoracic spine was
performed without intravenous contrast administration. Multiplanar
CT image reconstructions were also generated

[Series 6: coronals bone · coronal · 0.60mm/px · 1 of 58 slices shown]
[im 29/58  bone]
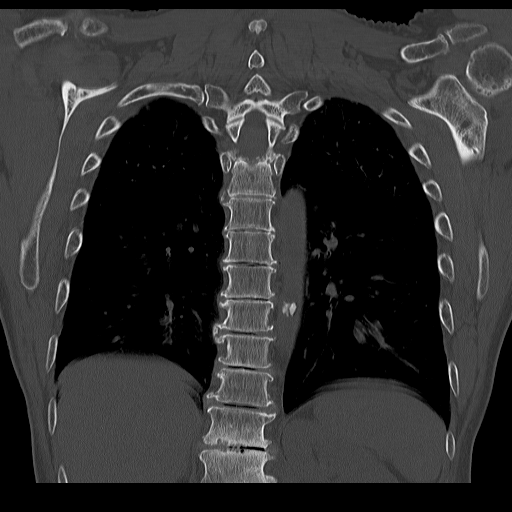

[Series 8: 2mm soft tissue · axial · 0.32mm/px · z∈[-285,-81]mm · 6 of 144 slices shown]
[im 21/144  soft-tissue]
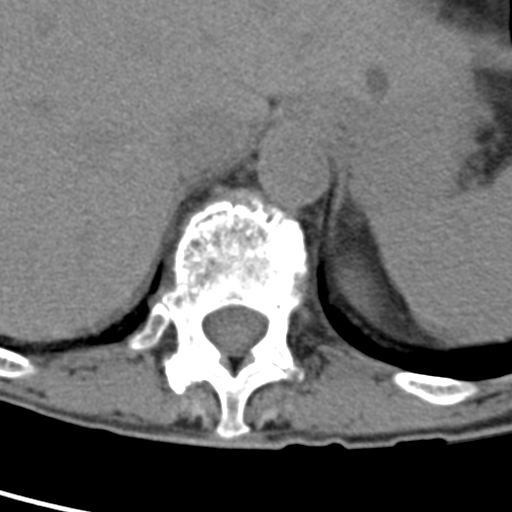
[im 41/144  soft-tissue]
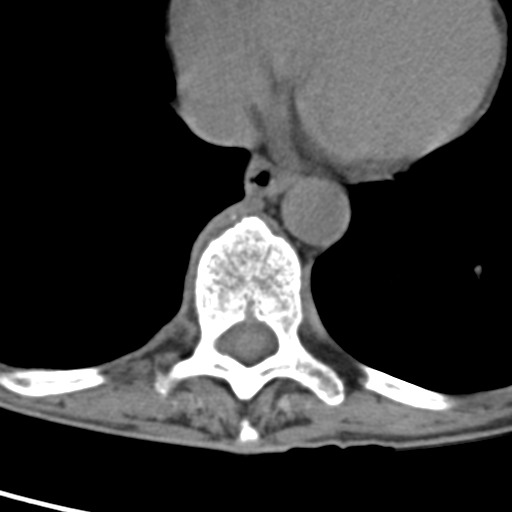
[im 62/144  soft-tissue]
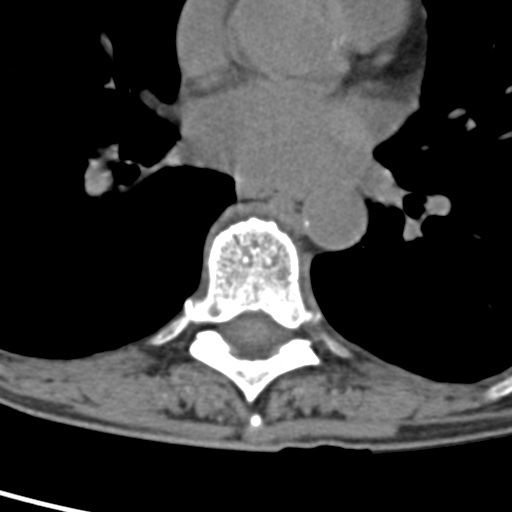
[im 82/144  soft-tissue]
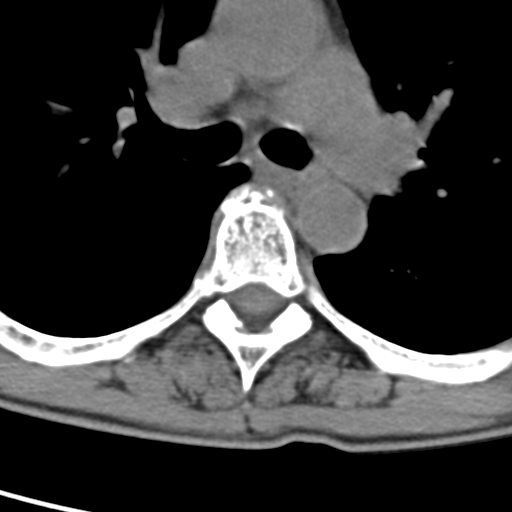
[im 103/144  soft-tissue]
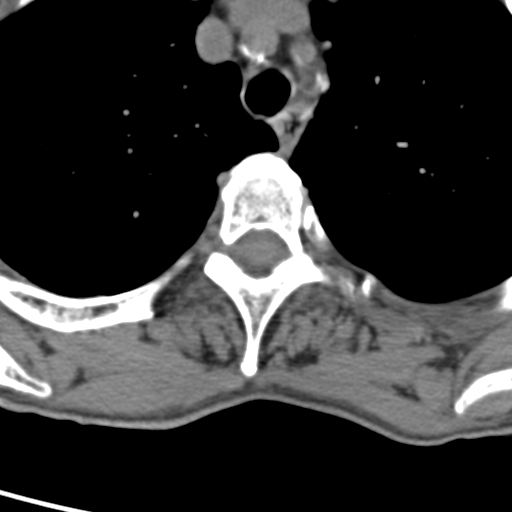
[im 123/144  soft-tissue]
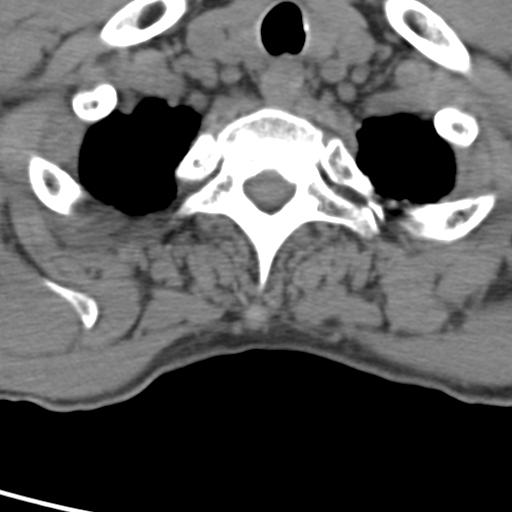

[Series 11: sagittals · sagittal · 0.56mm/px · 5 of 54 slices shown, 6 images]
[im 18/54  bone]
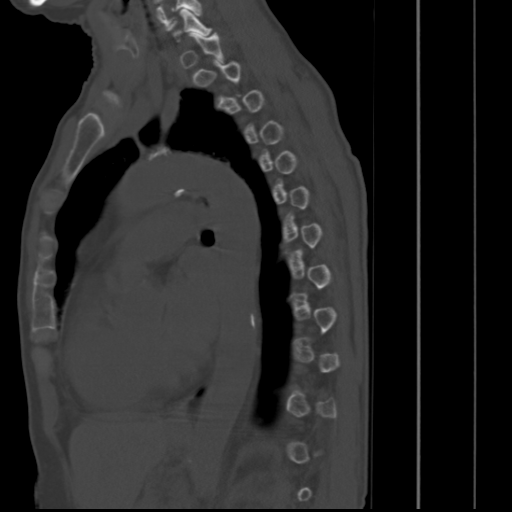
[im 23/54  bone]
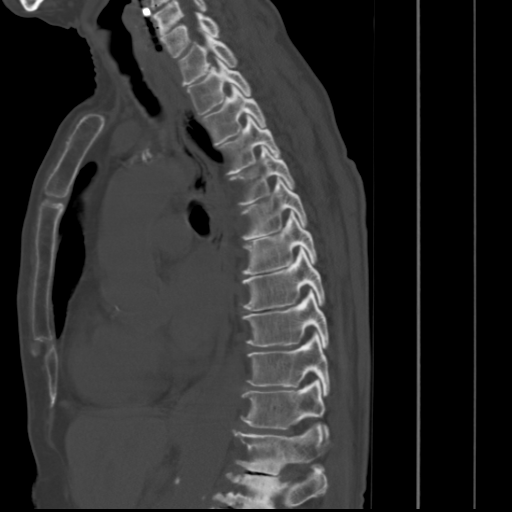
[im 27/54  soft-tissue]
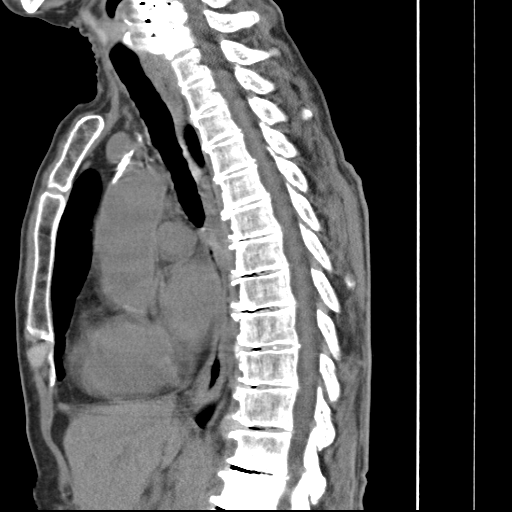
[im 27/54  bone]
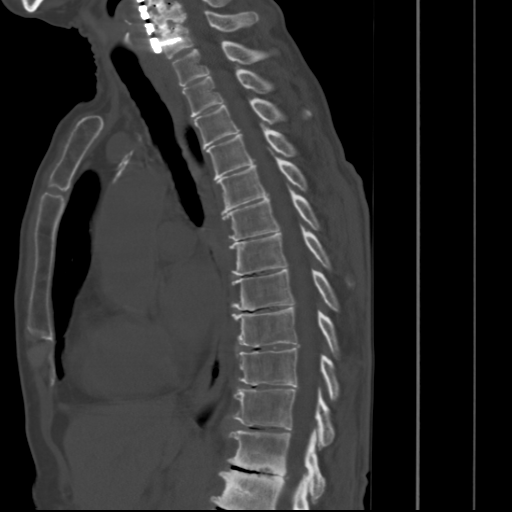
[im 31/54  bone]
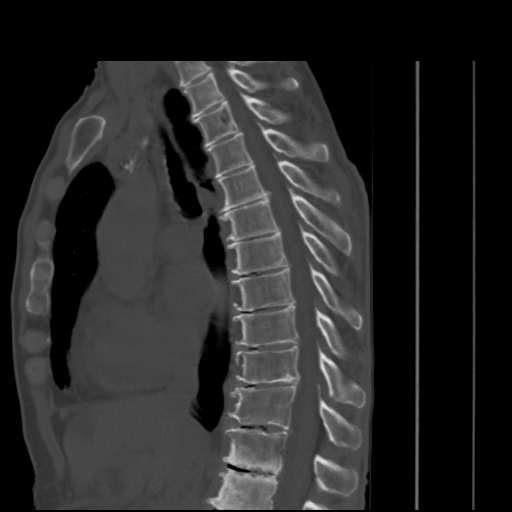
[im 36/54  bone]
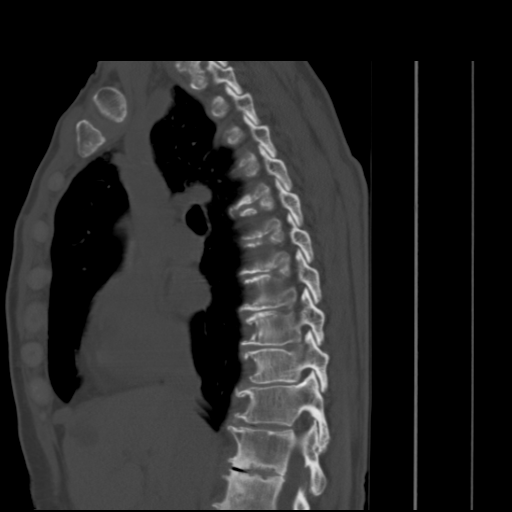

[Series 602: axial · axial · 0.36mm/px · z∈[-288,-106]mm · 4 of 100 slices shown, 5 images]
[im 20/100  soft-tissue]
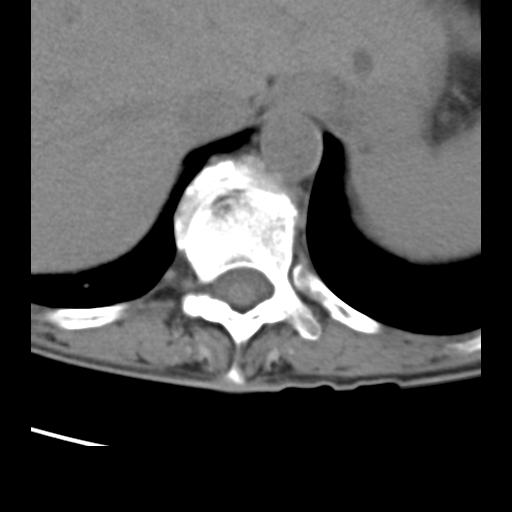
[im 20/100  bone]
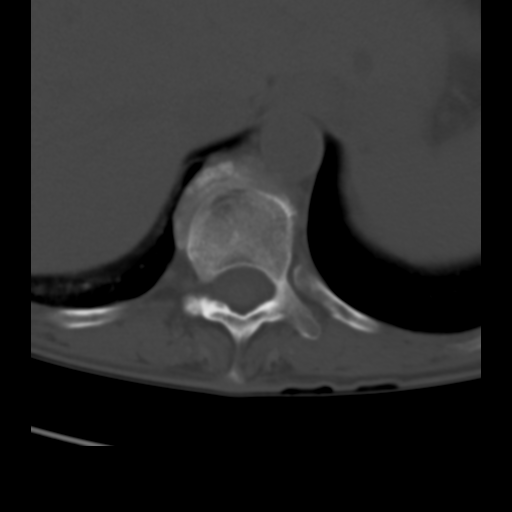
[im 40/100  bone]
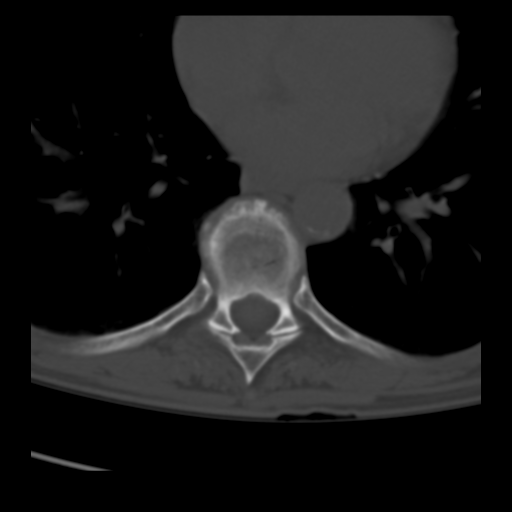
[im 60/100  bone]
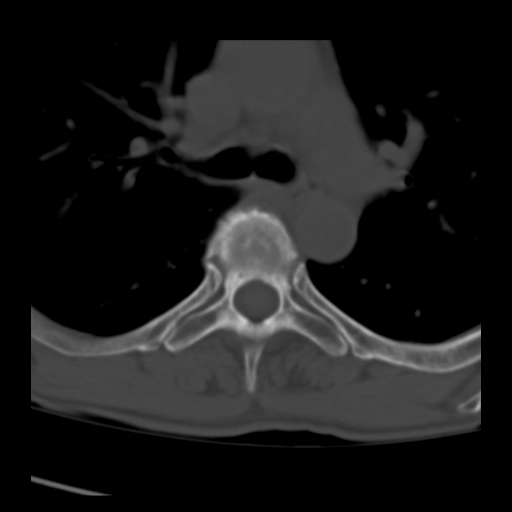
[im 80/100  bone]
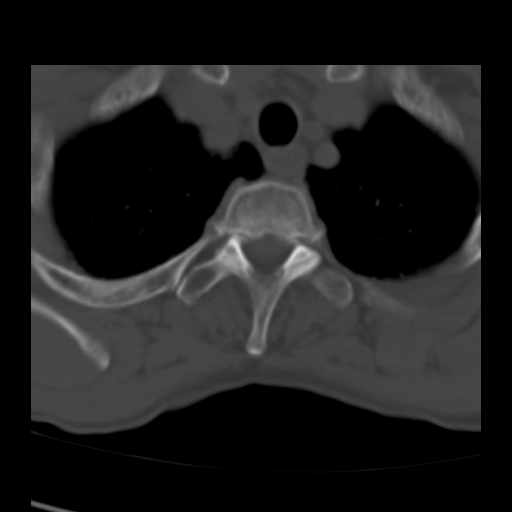

[16 of 33 positions shown; findings below may reference images not displayed]

FINDINGS: Vertebral body height and alignment are maintained.  The
patient has multilevel degenerative disease with vacuum disc
phenomenon and anterior endplate spurring.  Endplate sclerosis
appears worst at T12-L1.  Although not as well demonstrated on CT
scan, disc bulging appears most prominent at T4-5, T6-7 and T9-10.
Imaged lung parenchyma is clear. Partial visualization of lower
cervical fusion noted.
IMPRESSION: Multilevel degenerative change.  No fracture or other focal
abnormality.

## 2014-07-23 ENCOUNTER — Other Ambulatory Visit: Payer: Self-pay | Admitting: Neurosurgery

## 2014-07-23 DIAGNOSIS — S32009K Unspecified fracture of unspecified lumbar vertebra, subsequent encounter for fracture with nonunion: Secondary | ICD-10-CM

## 2014-07-30 ENCOUNTER — Ambulatory Visit
Admission: RE | Admit: 2014-07-30 | Discharge: 2014-07-30 | Disposition: A | Discharge status: Home / Self Care | Payer: Medicare Other | Source: Ambulatory Visit | Attending: Neurosurgery | Admitting: Neurosurgery

## 2014-07-30 DIAGNOSIS — S32009K Unspecified fracture of unspecified lumbar vertebra, subsequent encounter for fracture with nonunion: Secondary | ICD-10-CM

## 2015-05-13 ENCOUNTER — Ambulatory Visit (HOSPITAL_COMMUNITY)
Admission: RE | Admit: 2015-05-13 | Discharge: 2015-05-13 | Disposition: A | Discharge status: Home / Self Care | Payer: Medicare Other | Source: Ambulatory Visit | Attending: Vascular Surgery | Admitting: Vascular Surgery

## 2015-05-13 ENCOUNTER — Other Ambulatory Visit (HOSPITAL_COMMUNITY): Payer: Self-pay | Admitting: Neurosurgery

## 2015-05-13 DIAGNOSIS — I739 Peripheral vascular disease, unspecified: Secondary | ICD-10-CM

## 2015-05-18 ENCOUNTER — Other Ambulatory Visit: Payer: Self-pay | Admitting: Neurosurgery

## 2015-05-18 DIAGNOSIS — M542 Cervicalgia: Secondary | ICD-10-CM

## 2015-05-18 DIAGNOSIS — M5136 Other intervertebral disc degeneration, lumbar region: Secondary | ICD-10-CM

## 2015-05-28 ENCOUNTER — Ambulatory Visit
Admission: RE | Admit: 2015-05-28 | Discharge: 2015-05-28 | Disposition: A | Discharge status: Home / Self Care | Payer: Medicare (Managed Care) | Source: Ambulatory Visit | Attending: Neurosurgery | Admitting: Neurosurgery

## 2015-05-28 DIAGNOSIS — M542 Cervicalgia: Secondary | ICD-10-CM

## 2015-05-28 DIAGNOSIS — M5136 Other intervertebral disc degeneration, lumbar region: Secondary | ICD-10-CM

## 2015-06-03 ENCOUNTER — Other Ambulatory Visit: Payer: Self-pay | Admitting: Neurosurgery

## 2015-06-03 DIAGNOSIS — R911 Solitary pulmonary nodule: Secondary | ICD-10-CM

## 2022-09-27 DIAGNOSIS — S129XXA Fracture of neck, unspecified, initial encounter: Secondary | ICD-10-CM

## 2022-09-27 HISTORY — DX: Fracture of neck, unspecified, initial encounter: S12.9XXA

## 2022-10-18 ENCOUNTER — Other Ambulatory Visit: Payer: Self-pay | Admitting: Neurosurgery

## 2022-10-20 NOTE — Progress Notes (Signed)
Surgical Instructions    Your procedure is scheduled on Wednesday November 01, 2022.  Report to [facility] Main Entrance "A" at 10:00 A.M., then check in with the Admitting office.  Call this number if you have problems the morning of surgery:  [phone]   If you have any questions prior to your surgery date call [phone]: Open Monday-Friday 8am-4pm If you experience any cold or flu symptoms such as cough, fever, chills, shortness of breath, etc. between now and your scheduled surgery, please notify us at the above number     Remember:  Do not eat after midnight the night before your surgery  You may drink clear liquids until 9:00 the morning of your surgery.   Clear liquids allowed are: Water, Non-Citrus Juices (without pulp), Carbonated Beverages, Clear Tea, Black Coffee ONLY (NO MILK, CREAM OR POWDERED CREAMER of any kind), and Gatorade.     Take these medicines the morning of surgery with A SIP OF WATER:  levothyroxine (SYNTHROID  LINZESS   If needed:  acetaminophen (TYLENOL)   As of today, STOP taking any Aspirin (unless otherwise instructed by your surgeon) Aleve, Naproxen, Ibuprofen, Motrin, Advil, Goody's, BC's, all herbal medications, fish oil, and all vitamins.  Special instructions:    Oral Hygiene is also important to reduce your risk of infection.  Remember - BRUSH YOUR TEETH THE MORNING OF SURGERY WITH YOUR REGULAR TOOTHPASTE   [facility]- Preparing For Surgery   Pre-operative 5 CHG Bath Instructions   You can play a key role in reducing the risk of infection after surgery. Your skin needs to be as free of germs as possible. You can reduce the number of germs on your skin by washing with CHG (chlorhexidine gluconate) soap before surgery. CHG is an antiseptic soap that kills germs and continues to kill germs even after washing.   DO NOT use if you have an allergy to chlorhexidine/CHG or antibacterial soaps. If your skin becomes reddened or irritated, stop using  the CHG and notify one of our RNs at [phone].   Please shower with the CHG soap starting 4 days before surgery using the following schedule:     Please keep in mind the following:  DO NOT shave, including legs and underarms, starting the day of your first shower.   You may shave your face at any point before/day of surgery.  Place clean sheets on your bed the day you start using CHG soap. Use a clean washcloth (not used since being washed) for each shower. DO NOT sleep with pets once you start using the CHG.   CHG Shower Instructions:  If you choose to wash your hair and private area, wash first with your normal shampoo/soap.  After you use shampoo/soap, rinse your hair and body thoroughly to remove shampoo/soap residue.  Turn the water OFF and apply about 3 tablespoons (45 ml) of CHG soap to a CLEAN washcloth.  Apply CHG soap ONLY FROM YOUR NECK DOWN TO YOUR TOES (washing for 3-5 minutes)  DO NOT use CHG soap on face, private areas, open wounds, or sores.  Pay special attention to the area where your surgery is being performed.  If you are having back surgery, having someone wash your back for you may be helpful. Wait 2 minutes after CHG soap is applied, then you may rinse off the CHG soap.  Pat dry with a clean towel  Put on clean clothes/pajamas   If you choose to wear lotion, please use ONLY the CHG-compatible  lotions on the back of this paper.     Additional instructions for the day of surgery: DO NOT APPLY any lotions, deodorants, cologne, or perfumes.   Put on clean/comfortable clothes.  Brush your teeth.  Ask your nurse before applying any prescription medications to the skin.      CHG Compatible Lotions   Aveeno Moisturizing lotion  Cetaphil Moisturizing Cream  Cetaphil Moisturizing Lotion  Clairol Herbal Essence Moisturizing Lotion, Dry Skin  Clairol Herbal Essence Moisturizing Lotion, Extra Dry Skin  Clairol Herbal Essence Moisturizing Lotion, Normal Skin   Curel Age Defying Therapeutic Moisturizing Lotion with Alpha Hydroxy  Curel Extreme Care Body Lotion  Curel Soothing Hands Moisturizing Hand Lotion  Curel Therapeutic Moisturizing Cream, Fragrance-Free  Curel Therapeutic Moisturizing Lotion, Fragrance-Free  Curel Therapeutic Moisturizing Lotion, Original Formula  Eucerin Daily Replenishing Lotion  Eucerin Dry Skin Therapy Plus Alpha Hydroxy Crme  Eucerin Dry Skin Therapy Plus Alpha Hydroxy Lotion  Eucerin Original Crme  Eucerin Original Lotion  Eucerin Plus Crme Eucerin Plus Lotion  Eucerin TriLipid Replenishing Lotion  Keri Anti-Bacterial Hand Lotion  Keri Deep Conditioning Original Lotion Dry Skin Formula Softly Scented  Keri Deep Conditioning Original Lotion, Fragrance Free Sensitive Skin Formula  Keri Lotion Fast Absorbing Fragrance Free Sensitive Skin Formula  Keri Lotion Fast Absorbing Softly Scented Dry Skin Formula  Keri Original Lotion  Keri Skin Renewal Lotion Keri Silky Smooth Lotion  Keri Silky Smooth Sensitive Skin Lotion  Nivea Body Creamy Conditioning Oil  Nivea Body Extra Enriched Teacher, adult education Moisturizing Lotion Nivea Crme  Nivea Skin Firming Lotion  NutraDerm 30 Skin Lotion  NutraDerm Skin Lotion  NutraDerm Therapeutic Skin Cream  NutraDerm Therapeutic Skin Lotion  ProShield Protective Hand Cream  Provon moisturizing lotion   Do not wear jewelry or makeup. Do not wear lotions, powders, perfumes/cologne or deodorant. Do not shave 48 hours prior to surgery.  Men may shave face and neck. Do not bring valuables to the hospital. Do not wear nail polish, gel polish, artificial nails, or any other type of covering on natural nails (fingers and toes) If you have artificial nails or gel coating that need to be removed by a nail salon, please have this removed prior to surgery. Artificial nails or gel coating may interfere with anesthesia's ability to adequately monitor your  vital signs.  [facility] is not responsible for any belongings or valuables.    Do NOT Smoke (Tobacco/Vaping)  24 hours prior to your procedure  If you use a CPAP at night, you may bring your mask for your overnight stay.   Contacts, glasses, hearing aids, dentures or partials may not be worn into surgery, please bring cases for these belongings   For patients admitted to the hospital, discharge time will be determined by your treatment team.   Patients discharged the day of surgery will not be allowed to drive home, and someone needs to stay with them for 24 hours.   SURGICAL WAITING ROOM VISITATION Patients having surgery or a procedure may have no more than 2 support people in the waiting area - these visitors may rotate.   Children under the age of 40 must have an adult with them who is not the patient. If the patient needs to stay at the hospital during part of their recovery, the visitor guidelines for inpatient rooms apply. Pre-op nurse will coordinate an appropriate time for 1 support person to accompany patient in pre-op.  This support person  may not rotate.   Please refer to [email or web address] for the visitor guidelines for Inpatients (after your surgery is over and you are in a regular room).   If you received a COVID test during your pre-op visit, it is requested that you wear a mask when out in public, stay away from anyone that may not be feeling well, and notify your surgeon if you develop symptoms. If you have been in contact with anyone that has tested positive in the last 10 days, please notify your surgeon.    Please read over the following fact sheets that you were given.

## 2022-10-24 ENCOUNTER — Other Ambulatory Visit: Payer: Self-pay

## 2022-10-24 ENCOUNTER — Encounter (HOSPITAL_COMMUNITY): Payer: Self-pay

## 2022-10-24 ENCOUNTER — Encounter (HOSPITAL_COMMUNITY)
Admission: RE | Admit: 2022-10-24 | Discharge: 2022-10-24 | Disposition: A | Discharge status: Home / Self Care | Payer: Medicare Other | Source: Ambulatory Visit | Attending: Neurosurgery | Admitting: Neurosurgery

## 2022-10-24 VITALS — BP 150/48 | HR 76 | Temp 97.6°F | Resp 16 | Ht 59.0 in | Wt 119.0 lb

## 2022-10-24 DIAGNOSIS — Z79899 Other long term (current) drug therapy: Secondary | ICD-10-CM | POA: Insufficient documentation

## 2022-10-24 DIAGNOSIS — Z01812 Encounter for preprocedural laboratory examination: Secondary | ICD-10-CM | POA: Diagnosis not present

## 2022-10-24 DIAGNOSIS — Z01818 Encounter for other preprocedural examination: Secondary | ICD-10-CM

## 2022-10-24 HISTORY — DX: Other complications of anesthesia, initial encounter: T88.59XA

## 2022-10-24 LAB — CBC
HCT: 45.8 % (ref 36.0–46.0)
Hemoglobin: 14.8 g/dL (ref 12.0–15.0)
MCH: 30.1 pg (ref 26.0–34.0)
MCHC: 32.3 g/dL (ref 30.0–36.0)
MCV: 93.1 fL (ref 80.0–100.0)
Platelets: 226 10*3/uL (ref 150–400)
RBC: 4.92 MIL/uL (ref 3.87–5.11)
RDW: 12.3 % (ref 11.5–15.5)
WBC: 7 10*3/uL (ref 4.0–10.5)
nRBC: 0 % (ref 0.0–0.2)

## 2022-10-24 LAB — SURGICAL PCR SCREEN

## 2022-10-24 NOTE — Progress Notes (Signed)
PCP - Stefan Church Cardiologist - Denies  PPM/ICD - Denies  Chest x-ray - N/I EKG - N/I Stress Test - Denies ECHO - Denies Cardiac Cath - Denies  Sleep Study - Denies  DM - Denies  Blood Thinner Instructions:N/A Aspirin Instructions:N/A  ERAS Protcol -Yes   COVID TEST- N/A   Anesthesia review: No  Patient denies shortness of breath, fever, cough and chest pain at PAT appointment   All instructions explained to the patient, with a verbal understanding of the material. Patient agrees to go over the instructions while at home for a better understanding. The opportunity to ask questions was provided.

## 2022-10-25 NOTE — Progress Notes (Signed)
Surgical PCR result taken at pre-op appointment invalid. PCR will need to be recollect on DOS. Order placed.

## 2022-11-01 ENCOUNTER — Inpatient Hospital Stay (HOSPITAL_COMMUNITY): Payer: Medicare Other

## 2022-11-01 ENCOUNTER — Encounter (HOSPITAL_COMMUNITY): Payer: Self-pay | Admitting: Neurosurgery

## 2022-11-01 ENCOUNTER — Encounter (HOSPITAL_COMMUNITY)
Admission: RE | Disposition: A | Discharge status: Home / Self Care | Payer: Self-pay | Source: Home / Self Care | Attending: Neurosurgery

## 2022-11-01 ENCOUNTER — Other Ambulatory Visit: Payer: Self-pay

## 2022-11-01 ENCOUNTER — Inpatient Hospital Stay (HOSPITAL_COMMUNITY)
Admission: RE | Admit: 2022-11-01 | Discharge: 2022-11-03 | DRG: 472 | Disposition: A | Discharge status: Home / Self Care | Payer: Medicare Other | Attending: Neurosurgery | Admitting: Neurosurgery

## 2022-11-01 ENCOUNTER — Inpatient Hospital Stay (HOSPITAL_COMMUNITY): Payer: Medicare Other | Admitting: Anesthesiology

## 2022-11-01 DIAGNOSIS — M5412 Radiculopathy, cervical region: Secondary | ICD-10-CM | POA: Diagnosis present

## 2022-11-01 DIAGNOSIS — Z79890 Hormone replacement therapy (postmenopausal): Secondary | ICD-10-CM | POA: Diagnosis not present

## 2022-11-01 DIAGNOSIS — Z87891 Personal history of nicotine dependence: Secondary | ICD-10-CM

## 2022-11-01 DIAGNOSIS — Z8249 Family history of ischemic heart disease and other diseases of the circulatory system: Secondary | ICD-10-CM

## 2022-11-01 DIAGNOSIS — M532X2 Spinal instabilities, cervical region: Secondary | ICD-10-CM | POA: Diagnosis present

## 2022-11-01 DIAGNOSIS — J45909 Unspecified asthma, uncomplicated: Secondary | ICD-10-CM | POA: Diagnosis not present

## 2022-11-01 DIAGNOSIS — Z882 Allergy status to sulfonamides status: Secondary | ICD-10-CM | POA: Diagnosis not present

## 2022-11-01 DIAGNOSIS — Z825 Family history of asthma and other chronic lower respiratory diseases: Secondary | ICD-10-CM

## 2022-11-01 DIAGNOSIS — Z885 Allergy status to narcotic agent status: Secondary | ICD-10-CM

## 2022-11-01 DIAGNOSIS — T84216A Breakdown (mechanical) of internal fixation device of vertebrae, initial encounter: Secondary | ICD-10-CM | POA: Diagnosis present

## 2022-11-01 DIAGNOSIS — E039 Hypothyroidism, unspecified: Secondary | ICD-10-CM | POA: Diagnosis present

## 2022-11-01 DIAGNOSIS — Z801 Family history of malignant neoplasm of trachea, bronchus and lung: Secondary | ICD-10-CM | POA: Diagnosis not present

## 2022-11-01 DIAGNOSIS — S129XXD Fracture of neck, unspecified, subsequent encounter: Secondary | ICD-10-CM

## 2022-11-01 DIAGNOSIS — Z79899 Other long term (current) drug therapy: Secondary | ICD-10-CM

## 2022-11-01 DIAGNOSIS — M960 Pseudarthrosis after fusion or arthrodesis: Principal | ICD-10-CM | POA: Diagnosis present

## 2022-11-01 DIAGNOSIS — Z01818 Encounter for other preprocedural examination: Principal | ICD-10-CM

## 2022-11-01 DIAGNOSIS — F32A Depression, unspecified: Secondary | ICD-10-CM | POA: Diagnosis present

## 2022-11-01 HISTORY — PX: POSTERIOR CERVICAL FUSION/FORAMINOTOMY: SHX5038

## 2022-11-01 LAB — TYPE AND SCREEN
ABO/RH(D): O POS
Antibody Screen: NEGATIVE

## 2022-11-01 LAB — SURGICAL PCR SCREEN
MRSA, PCR: NEGATIVE
Staphylococcus aureus: NEGATIVE

## 2022-11-01 SURGERY — POSTERIOR CERVICAL FUSION/FORAMINOTOMY LEVEL 5
Anesthesia: General | Site: Spine Cervical

## 2022-11-01 MED ORDER — ACETAMINOPHEN 650 MG RE SUPP: 650.0000 mg | RECTAL | Status: DC | PRN

## 2022-11-01 MED ORDER — FENTANYL CITRATE (PF) 250 MCG/5ML IJ SOLN
INTRAMUSCULAR | Status: DC | PRN
  Administered 2022-11-01 (×5): 50 ug via INTRAVENOUS

## 2022-11-01 MED ORDER — ACETAMINOPHEN 500 MG PO TABS
1000.0000 mg | ORAL_TABLET | Freq: Once | ORAL | Status: AC
  Administered 2022-11-01: 1000 mg via ORAL
  Filled 2022-11-01: qty 2

## 2022-11-01 MED ORDER — THROMBIN 20000 UNITS EX SOLR: CUTANEOUS | Status: DC | PRN

## 2022-11-01 MED ORDER — LACTATED RINGERS IV SOLN: INTRAVENOUS | Status: DC

## 2022-11-01 MED ORDER — CHLORHEXIDINE GLUCONATE CLOTH 2 % EX PADS: 6.0000 | MEDICATED_PAD | Freq: Once | CUTANEOUS | Status: DC

## 2022-11-01 MED ORDER — PROPOFOL 10 MG/ML IV BOLUS
INTRAVENOUS | Status: DC | PRN
  Administered 2022-11-01: 120 mg via INTRAVENOUS
  Administered 2022-11-01: 40 mg via INTRAVENOUS

## 2022-11-01 MED ORDER — CHLORHEXIDINE GLUCONATE CLOTH 2 % EX PADS: 6.0000 | MEDICATED_PAD | Freq: Every day | CUTANEOUS | Status: DC

## 2022-11-01 MED ORDER — BACITRACIN ZINC 500 UNIT/GM EX OINT
TOPICAL_OINTMENT | CUTANEOUS | Status: DC | PRN
  Administered 2022-11-01: 1 via TOPICAL

## 2022-11-01 MED ORDER — SUGAMMADEX SODIUM 200 MG/2ML IV SOLN
INTRAVENOUS | Status: DC | PRN
  Administered 2022-11-01: 200 mg via INTRAVENOUS

## 2022-11-01 MED ORDER — CHLORHEXIDINE GLUCONATE 0.12 % MT SOLN
15.0000 mL | Freq: Once | OROMUCOSAL | Status: AC
  Administered 2022-11-01: 15 mL via OROMUCOSAL
  Filled 2022-11-01: qty 15

## 2022-11-01 MED ORDER — LIDOCAINE 2% (20 MG/ML) 5 ML SYRINGE
INTRAMUSCULAR | Status: AC
  Filled 2022-11-01: qty 5

## 2022-11-01 MED ORDER — ACETAMINOPHEN 325 MG PO TABS: 650.0000 mg | ORAL_TABLET | Freq: Four times a day (QID) | ORAL | Status: DC | PRN

## 2022-11-01 MED ORDER — PHENYLEPHRINE HCL-NACL 20-0.9 MG/250ML-% IV SOLN
INTRAVENOUS | Status: DC | PRN
  Administered 2022-11-01: 20 ug/min via INTRAVENOUS

## 2022-11-01 MED ORDER — PROPOFOL 10 MG/ML IV BOLUS
INTRAVENOUS | Status: AC
  Filled 2022-11-01: qty 20

## 2022-11-01 MED ORDER — DEXAMETHASONE SODIUM PHOSPHATE 10 MG/ML IJ SOLN
INTRAMUSCULAR | Status: DC | PRN
  Administered 2022-11-01: 5 mg via INTRAVENOUS

## 2022-11-01 MED ORDER — PHENYLEPHRINE 80 MCG/ML (10ML) SYRINGE FOR IV PUSH (FOR BLOOD PRESSURE SUPPORT)
PREFILLED_SYRINGE | INTRAVENOUS | Status: AC
  Filled 2022-11-01: qty 10

## 2022-11-01 MED ORDER — FENTANYL CITRATE (PF) 100 MCG/2ML IJ SOLN
25.0000 ug | INTRAMUSCULAR | Status: DC | PRN
  Administered 2022-11-01: 50 ug via INTRAVENOUS
  Administered 2022-11-01 (×4): 25 ug via INTRAVENOUS

## 2022-11-01 MED ORDER — SODIUM CHLORIDE 0.9 % IV SOLN: 250.0000 mL | INTRAVENOUS | Status: DC

## 2022-11-01 MED ORDER — CEFAZOLIN SODIUM-DEXTROSE 2-4 GM/100ML-% IV SOLN: 2.0000 g | Freq: Three times a day (TID) | INTRAVENOUS | Status: DC

## 2022-11-01 MED ORDER — LIDOCAINE-EPINEPHRINE 1 %-1:100000 IJ SOLN
INTRAMUSCULAR | Status: DC | PRN
  Administered 2022-11-01: 10 mL

## 2022-11-01 MED ORDER — ONDANSETRON HCL 4 MG PO TABS: 4.0000 mg | ORAL_TABLET | Freq: Four times a day (QID) | ORAL | Status: DC | PRN

## 2022-11-01 MED ORDER — PANTOPRAZOLE SODIUM 40 MG PO TBEC
40.0000 mg | DELAYED_RELEASE_TABLET | Freq: Every day | ORAL | Status: DC
  Administered 2022-11-01 – 2022-11-02 (×2): 40 mg via ORAL
  Filled 2022-11-01 (×2): qty 1

## 2022-11-01 MED ORDER — ONDANSETRON HCL 4 MG/2ML IJ SOLN
INTRAMUSCULAR | Status: DC | PRN
  Administered 2022-11-01: 4 mg via INTRAVENOUS

## 2022-11-01 MED ORDER — ROCURONIUM BROMIDE 10 MG/ML (PF) SYRINGE
PREFILLED_SYRINGE | INTRAVENOUS | Status: DC | PRN
  Administered 2022-11-01: 30 mg via INTRAVENOUS
  Administered 2022-11-01: 50 mg via INTRAVENOUS
  Administered 2022-11-01 (×2): 20 mg via INTRAVENOUS

## 2022-11-01 MED ORDER — ONDANSETRON HCL 4 MG/2ML IJ SOLN: 4.0000 mg | Freq: Once | INTRAMUSCULAR | Status: DC | PRN

## 2022-11-01 MED ORDER — ONDANSETRON HCL 4 MG/2ML IJ SOLN
INTRAMUSCULAR | Status: AC
  Filled 2022-11-01: qty 2

## 2022-11-01 MED ORDER — HYDROCODONE-ACETAMINOPHEN 5-325 MG PO TABS
2.0000 | ORAL_TABLET | ORAL | Status: DC | PRN
  Administered 2022-11-01 – 2022-11-03 (×9): 2 via ORAL
  Filled 2022-11-01 (×10): qty 2

## 2022-11-01 MED ORDER — LACTATED RINGERS IV SOLN: INTRAVENOUS | Status: DC | PRN

## 2022-11-01 MED ORDER — THROMBIN 20000 UNITS EX SOLR
CUTANEOUS | Status: AC
  Filled 2022-11-01: qty 20000

## 2022-11-01 MED ORDER — VITAMIN D (ERGOCALCIFEROL) 1.25 MG (50000 UNIT) PO CAPS
50000.0000 [IU] | ORAL_CAPSULE | ORAL | Status: DC
  Filled 2022-11-01: qty 1

## 2022-11-01 MED ORDER — HYDROMORPHONE HCL 1 MG/ML IJ SOLN
0.5000 mg | INTRAMUSCULAR | Status: DC | PRN
  Administered 2022-11-01 – 2022-11-02 (×3): 0.5 mg via INTRAVENOUS
  Filled 2022-11-01 (×4): qty 0.5

## 2022-11-01 MED ORDER — CYCLOBENZAPRINE HCL 10 MG PO TABS
10.0000 mg | ORAL_TABLET | Freq: Three times a day (TID) | ORAL | Status: DC | PRN
  Administered 2022-11-01 – 2022-11-03 (×5): 10 mg via ORAL
  Filled 2022-11-01 (×5): qty 1

## 2022-11-01 MED ORDER — ACETAMINOPHEN 325 MG PO TABS: 650.0000 mg | ORAL_TABLET | ORAL | Status: DC | PRN

## 2022-11-01 MED ORDER — LIDOCAINE 2% (20 MG/ML) 5 ML SYRINGE
INTRAMUSCULAR | Status: DC | PRN
  Administered 2022-11-01: 60 mg via INTRAVENOUS

## 2022-11-01 MED ORDER — ALUM & MAG HYDROXIDE-SIMETH 200-200-20 MG/5ML PO SUSP: 30.0000 mL | Freq: Four times a day (QID) | ORAL | Status: DC | PRN

## 2022-11-01 MED ORDER — OXYCODONE HCL 5 MG/5ML PO SOLN: 5.0000 mg | Freq: Once | ORAL | Status: AC | PRN

## 2022-11-01 MED ORDER — CEFAZOLIN SODIUM-DEXTROSE 2-4 GM/100ML-% IV SOLN
2.0000 g | INTRAVENOUS | Status: AC
  Administered 2022-11-01: 2 g via INTRAVENOUS
  Filled 2022-11-01: qty 100

## 2022-11-01 MED ORDER — FENTANYL CITRATE (PF) 250 MCG/5ML IJ SOLN
INTRAMUSCULAR | Status: AC
  Filled 2022-11-01: qty 5

## 2022-11-01 MED ORDER — BUPIVACAINE HCL (PF) 0.25 % IJ SOLN
INTRAMUSCULAR | Status: AC
  Filled 2022-11-01: qty 30

## 2022-11-01 MED ORDER — BUPIVACAINE HCL (PF) 0.25 % IJ SOLN
INTRAMUSCULAR | Status: DC | PRN
  Administered 2022-11-01: 30 mL
  Administered 2022-11-01: 10 mL

## 2022-11-01 MED ORDER — PHENYLEPHRINE 80 MCG/ML (10ML) SYRINGE FOR IV PUSH (FOR BLOOD PRESSURE SUPPORT)
PREFILLED_SYRINGE | INTRAVENOUS | Status: DC | PRN
  Administered 2022-11-01 (×2): 80 ug via INTRAVENOUS

## 2022-11-01 MED ORDER — LIDOCAINE-EPINEPHRINE 1 %-1:100000 IJ SOLN
INTRAMUSCULAR | Status: AC
  Filled 2022-11-01: qty 1

## 2022-11-01 MED ORDER — MENTHOL 3 MG MT LOZG: 1.0000 | LOZENGE | OROMUCOSAL | Status: DC | PRN

## 2022-11-01 MED ORDER — ALBUMIN HUMAN 5 % IV SOLN: INTRAVENOUS | Status: DC | PRN

## 2022-11-01 MED ORDER — ORAL CARE MOUTH RINSE: 15.0000 mL | Freq: Once | OROMUCOSAL | Status: AC

## 2022-11-01 MED ORDER — ROCURONIUM BROMIDE 10 MG/ML (PF) SYRINGE
PREFILLED_SYRINGE | INTRAVENOUS | Status: AC
  Filled 2022-11-01: qty 10

## 2022-11-01 MED ORDER — DEXAMETHASONE SODIUM PHOSPHATE 10 MG/ML IJ SOLN
INTRAMUSCULAR | Status: AC
  Filled 2022-11-01: qty 1

## 2022-11-01 MED ORDER — 0.9 % SODIUM CHLORIDE (POUR BTL) OPTIME
TOPICAL | Status: DC | PRN
  Administered 2022-11-01 (×2): 1000 mL

## 2022-11-01 MED ORDER — SODIUM CHLORIDE 0.9% FLUSH
3.0000 mL | Freq: Two times a day (BID) | INTRAVENOUS | Status: DC
  Administered 2022-11-01 – 2022-11-02 (×2): 3 mL via INTRAVENOUS

## 2022-11-01 MED ORDER — FENTANYL CITRATE (PF) 100 MCG/2ML IJ SOLN
INTRAMUSCULAR | Status: AC
  Filled 2022-11-01: qty 2

## 2022-11-01 MED ORDER — ONDANSETRON HCL 4 MG/2ML IJ SOLN: 4.0000 mg | Freq: Four times a day (QID) | INTRAMUSCULAR | Status: DC | PRN

## 2022-11-01 MED ORDER — LINACLOTIDE 145 MCG PO CAPS
145.0000 ug | ORAL_CAPSULE | Freq: Every day | ORAL | Status: DC
  Administered 2022-11-02 – 2022-11-03 (×2): 145 ug via ORAL
  Filled 2022-11-01 (×2): qty 1

## 2022-11-01 MED ORDER — OXYCODONE HCL 5 MG PO TABS
ORAL_TABLET | ORAL | Status: AC
  Filled 2022-11-01: qty 1

## 2022-11-01 MED ORDER — PANTOPRAZOLE SODIUM 40 MG IV SOLR
40.0000 mg | Freq: Every day | INTRAVENOUS | Status: DC
  Filled 2022-11-01: qty 10

## 2022-11-01 MED ORDER — CEFAZOLIN SODIUM-DEXTROSE 2-4 GM/100ML-% IV SOLN
2.0000 g | Freq: Three times a day (TID) | INTRAVENOUS | Status: DC
  Administered 2022-11-01 – 2022-11-03 (×5): 2 g via INTRAVENOUS
  Filled 2022-11-01 (×5): qty 100

## 2022-11-01 MED ORDER — SODIUM CHLORIDE 0.9% FLUSH: 3.0000 mL | INTRAVENOUS | Status: DC | PRN

## 2022-11-01 MED ORDER — LEVOTHYROXINE SODIUM 88 MCG PO TABS
88.0000 ug | ORAL_TABLET | Freq: Every day | ORAL | Status: DC
  Administered 2022-11-02 – 2022-11-03 (×2): 88 ug via ORAL
  Filled 2022-11-01 (×2): qty 1

## 2022-11-01 MED ORDER — OXYCODONE HCL 5 MG PO TABS
5.0000 mg | ORAL_TABLET | Freq: Once | ORAL | Status: AC | PRN
  Administered 2022-11-01: 5 mg via ORAL

## 2022-11-01 MED ORDER — PHENOL 1.4 % MT LIQD: 1.0000 | OROMUCOSAL | Status: DC | PRN

## 2022-11-01 MED ORDER — BACITRACIN ZINC 500 UNIT/GM EX OINT
TOPICAL_OINTMENT | CUTANEOUS | Status: AC
  Filled 2022-11-01: qty 28.35

## 2022-11-01 SURGICAL SUPPLY — 78 items
ADH SKN CLS APL DERMABOND .7 (GAUZE/BANDAGES/DRESSINGS) ×1
APL SKNCLS STERI-STRIP NONHPOA (GAUZE/BANDAGES/DRESSINGS) ×1
BAG COUNTER SPONGE SURGICOUNT (BAG) ×1 IMPLANT
BAG SPNG CNTER NS LX DISP (BAG) ×1
BENZOIN TINCTURE PRP APPL 2/3 (GAUZE/BANDAGES/DRESSINGS) ×2 IMPLANT
BIT DRILL NEURO 2X3.1 SFT TUCH (MISCELLANEOUS) IMPLANT
BIT DRILL NS LF DISP RELINE C (BIT) IMPLANT
BIT DRILL RELINE C (BIT) ×1
BIT DRL NS LF DISP RELINE C (BIT) ×1
BLADE CLIPPER SURG (BLADE) ×1 IMPLANT
BLADE SURG 11 STRL SS (BLADE) ×1 IMPLANT
BONE VIVIGEN FORMABLE 10CC (Bone Implant) ×1 IMPLANT
BUR MATCHSTICK NEURO 3.0 LAGG (BURR) ×1 IMPLANT
CANISTER SUCT 3000ML PPV (MISCELLANEOUS) ×1 IMPLANT
DERMABOND ADVANCED .7 DNX12 (GAUZE/BANDAGES/DRESSINGS) ×1 IMPLANT
DRAPE C-ARM 42X72 X-RAY (DRAPES) IMPLANT
DRAPE LAPAROTOMY 100X72 PEDS (DRAPES) ×1 IMPLANT
DRAPE MICROSCOPE SLANT 54X150 (MISCELLANEOUS) IMPLANT
DRAPE SURG 17X23 STRL (DRAPES) ×4 IMPLANT
DRILL NEURO 2X3.1 SOFT TOUCH (MISCELLANEOUS) ×1
DRSG OPSITE 4X5.5 SM (GAUZE/BANDAGES/DRESSINGS) IMPLANT
DRSG OPSITE POSTOP 4X8 (GAUZE/BANDAGES/DRESSINGS) IMPLANT
DURAPREP 26ML APPLICATOR (WOUND CARE) ×1 IMPLANT
ELECT REM PT RETURN 9FT ADLT (ELECTROSURGICAL) ×1
ELECTRODE REM PT RTRN 9FT ADLT (ELECTROSURGICAL) ×1 IMPLANT
EVACUATOR 1/8 PVC DRAIN (DRAIN) IMPLANT
GAUZE 4X4 16PLY ~~LOC~~+RFID DBL (SPONGE) IMPLANT
GAUZE SPONGE 4X4 12PLY STRL (GAUZE/BANDAGES/DRESSINGS) ×1 IMPLANT
GLOVE BIO SURGEON STRL SZ7 (GLOVE) IMPLANT
GLOVE BIO SURGEON STRL SZ8 (GLOVE) ×1 IMPLANT
GLOVE BIOGEL PI IND STRL 7.0 (GLOVE) IMPLANT
GLOVE EXAM NITRILE XL STR (GLOVE) IMPLANT
GLOVE INDICATOR 8.5 STRL (GLOVE) ×2 IMPLANT
GOWN STRL REUS W/ TWL LRG LVL3 (GOWN DISPOSABLE) IMPLANT
GOWN STRL REUS W/ TWL XL LVL3 (GOWN DISPOSABLE) ×1 IMPLANT
GOWN STRL REUS W/TWL 2XL LVL3 (GOWN DISPOSABLE) IMPLANT
GOWN STRL REUS W/TWL LRG LVL3 (GOWN DISPOSABLE)
GOWN STRL REUS W/TWL XL LVL3 (GOWN DISPOSABLE) ×1
GRAFT BNE MATRIX VG FRMBL L 10 (Bone Implant) IMPLANT
KIT BASIN OR (CUSTOM PROCEDURE TRAY) ×1 IMPLANT
KIT INFUSE XX SMALL 0.7CC (Orthopedic Implant) IMPLANT
KIT TURNOVER KIT B (KITS) ×1 IMPLANT
MARKER SKIN DUAL TIP RULER LAB (MISCELLANEOUS) ×1 IMPLANT
NDL HYPO 25X1 1.5 SAFETY (NEEDLE) ×1 IMPLANT
NDL SPNL 20GX3.5 QUINCKE YW (NEEDLE) ×1 IMPLANT
NEEDLE HYPO 25X1 1.5 SAFETY (NEEDLE) ×1 IMPLANT
NEEDLE SPNL 20GX3.5 QUINCKE YW (NEEDLE) ×1 IMPLANT
NS IRRIG 1000ML POUR BTL (IV SOLUTION) ×1 IMPLANT
PACK LAMINECTOMY NEURO (CUSTOM PROCEDURE TRAY) ×1 IMPLANT
PAD ARMBOARD 7.5X6 YLW CONV (MISCELLANEOUS) ×3 IMPLANT
PATTIES SURGICAL 1X1 (DISPOSABLE) IMPLANT
PIN MAYFIELD SKULL DISP (PIN) ×1 IMPLANT
ROD PB RELINE C 3.5X70 (Rod) IMPLANT
ROD PB RELINE C 3.5X80 (Rod) IMPLANT
SCREW LOCK RELINE C OPEN (Screw) IMPLANT
SCREW MA RELINE C 3.5X14 (Screw) ×3 IMPLANT
SCREW MA RELINE C 3.5X16 (Screw) IMPLANT
SCREW SP MA RELINE-C 3.5X12 (Screw) IMPLANT
SCREW SP MA RELINE-C 3.5X14 (Screw) IMPLANT
SCREW SP MA RELINE-C 3.5X16 (Screw) IMPLANT
SCREW SPINAL RELINE C 3.5X12 (Screw) ×9 IMPLANT
SOL ELECTROSURG ANTI STICK (MISCELLANEOUS)
SOLUTION ELECTROSURG ANTI STCK (MISCELLANEOUS) ×1 IMPLANT
SPIKE FLUID TRANSFER (MISCELLANEOUS) ×1 IMPLANT
SPONGE SURGIFOAM ABS GEL 100 (HEMOSTASIS) ×1 IMPLANT
SPONGE T-LAP 4X18 ~~LOC~~+RFID (SPONGE) IMPLANT
STRIP CLOSURE SKIN 1/2X4 (GAUZE/BANDAGES/DRESSINGS) ×1 IMPLANT
SUT ETHILON 4 0 PS 2 18 (SUTURE) IMPLANT
SUT VIC AB 0 CT1 18XCR BRD8 (SUTURE) ×1 IMPLANT
SUT VIC AB 0 CT1 8-18 (SUTURE) ×2
SUT VIC AB 2-0 CT1 18 (SUTURE) ×1 IMPLANT
SUT VIC AB 4-0 PS2 27 (SUTURE) ×1 IMPLANT
SYR CONTROL 10ML LL (SYRINGE) IMPLANT
TEMPLATE ROD VUEPOINT 240 DISP (NEUROSURGERY SUPPLIES) IMPLANT
TOWEL GREEN STERILE (TOWEL DISPOSABLE) ×1 IMPLANT
TOWEL GREEN STERILE FF (TOWEL DISPOSABLE) ×1 IMPLANT
TRAY FOLEY MTR SLVR 16FR STAT (SET/KITS/TRAYS/PACK) IMPLANT
WATER STERILE IRR 1000ML POUR (IV SOLUTION) ×1 IMPLANT

## 2022-11-01 NOTE — Anesthesia Procedure Notes (Signed)
Procedure Name: Intubation Date/Time: 11/01/2022 1:08 PM  Performed by: Maxine Glenn, CRNAPre-anesthesia Checklist: Patient identified, Emergency Drugs available, Suction available and Patient being monitored Patient Re-evaluated:Patient Re-evaluated prior to induction Oxygen Delivery Method: Circle System Utilized Preoxygenation: Pre-oxygenation with 100% oxygen Induction Type: IV induction Ventilation: Mask ventilation without difficulty Laryngoscope Size: Glidescope and 3 Grade View: Grade I Tube type: Oral Tube size: 7.0 mm Number of attempts: 1 Airway Equipment and Method: Video-laryngoscopy and Rigid stylet Placement Confirmation: ETT inserted through vocal cords under direct vision, positive ETCO2 and breath sounds checked- equal and bilateral Secured at: 21 cm Tube secured with: Tape Dental Injury: Teeth and Oropharynx as per pre-operative assessment  Difficulty Due To: Difficulty was anticipated and Difficult Airway- due to reduced neck mobility Comments: Previous cervical fusion, here for revision of cervical fusion. Maintained inline neck stability throughout induction.

## 2022-11-01 NOTE — Op Note (Signed)
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  Postoperative diagnosis: Same.  Procedure: Posterior cervical fusion utilizing pars screws at C2 lateral mass screws at C3, C4, C5, C6, and C7 utilizing the NuVasive lateral mass screw system.  2.  Posterior lateral arthrodesis C2-3, C3-4, C4-5, C5-6, C6-7 utilizing Vivigen and BMP.  Surgeon: Donalee Citrin.  Assistant: Julien Girt.  Anesthesia: General.  EBL: Minimal.  HPI: 72 year old female with longstanding neck pain previously undergone a C3-C7 fusion workup including plain films and CT scan showed instability at C2-3 and pseudoarthrosis with a broken plate throughout the remainder of her fusion from C2-C7 due to the patient's progression of clinical syndrome imaging findings of a conservative treatment I recommended posterior cervical fusion utilizing lateral mass screws and posterolateral arthrodesis from C2-C7.  I extensively reviewed the risks and benefits of the operation with the patient as well as perioperative course expectations of outcome and alternatives of surgery and she understood and agreed to proceed forward.Marland Kitchen  Operative procedure: Patient was brought into the OR was induced under general anesthesia positioned prone in pins the neck in slight flexion and locked in the [place] headrest backside of her head neck was shaved prepped and draped in routine sterile fashion after infiltration of 10 cc lidocaine with epi midline incision was made and Bovie electrocautery was used to take down the subcutaneous tissue and subperiosteal dissection was carried lamina of C2, C3, C4, C5, and C6, and C7 intraoperatively confirmed application appropriate level so then tension taken the lateral mass screw placement and I placed lateral mass screws at C3, C4, C5, C6, and C7 utilizing inferomedial to superior lateral technique all holes were drilled tapped probed and all screws had excellent purchase.  At  C2 I attempted to place C2 lateral laminar screws however bone quality was poor and it broke through so I diverted to C2 pars screws I performed to laminotomies 1 each side to have yellow palpate the medial aspect of the pars and pedicle and then under fluoroscopy placed 2-14 mm screws into the pars and pedicle of C2 bilaterally.  All screws had excellent purchase.  The wound was then copiously irrigated meticulous hemostasis was maintained I aggressively decorticated the lamina and lateral mass and joints facet joints bilaterally from C2 down to C7 and packed and extensive mount of Vivigen autograft and BMP at C2-3, C3-4, C4-5, C5-6, C6-7.  Then contoured to rods anchored everything in place and tightened everything down.  Then placed a medium Hemovac drain and closed the wound in layers with interrupted Vicryl and a running 4 subcuticular.  Dermabond benzoin Steri-Strips and sterile dressing was applied patient recovery in stable condition.  At the end the case all needle counts and sponge counts were correct.

## 2022-11-01 NOTE — Anesthesia Preprocedure Evaluation (Addendum)
Anesthesia Evaluation  Patient identified by MRN, date of birth, ID band Patient awake    Reviewed: Allergy & Precautions, NPO status , Patient's Chart, lab work & pertinent test results  History of Anesthesia Complications Negative for: history of anesthetic complications  Airway Mallampati: III  TM Distance: >3 FB Neck ROM: Limited    Dental  (+) Dental Advisory Given, Teeth Intact   Pulmonary asthma , former smoker   Pulmonary exam normal        Cardiovascular negative cardio ROS Normal cardiovascular exam     Neuro/Psych  PSYCHIATRIC DISORDERS  Depression     Cervical radiculopathy     GI/Hepatic negative GI ROS, Neg liver ROS,,,  Endo/Other  Hypothyroidism    Renal/GU negative Renal ROS     Musculoskeletal  (+) Arthritis ,    Abdominal   Peds  Hematology negative hematology ROS (+)   Anesthesia Other Findings   Reproductive/Obstetrics                             Anesthesia Physical Anesthesia Plan  ASA: 2  Anesthesia Plan: General   Post-op Pain Management: Tylenol PO (pre-op)*   Induction: Intravenous  PONV Risk Score and Plan: 3 and Treatment may vary due to age or medical condition, Ondansetron and Dexamethasone  Airway Management Planned: Oral ETT and Video Laryngoscope Planned  Additional Equipment: None  Intra-op Plan:   Post-operative Plan: Extubation in OR  Informed Consent: I have reviewed the patients History and Physical, chart, labs and discussed the procedure including the risks, benefits and alternatives for the proposed anesthesia with the patient or authorized representative who has indicated his/her understanding and acceptance.     Dental advisory given  Plan Discussed with: CRNA and Anesthesiologist  Anesthesia Plan Comments:        Anesthesia Quick Evaluation

## 2022-11-01 NOTE — H&P (Signed)
Donna French is an 72 y.o. female.   Chief Complaint: Neck pain bilateral shoulder and arm pain HPI: 72 year old female with longstanding issues with her neck previously undergone a C3-C7 fusion and presented with progressive worsening neck pain over the last several months with radiation down her arms and hands.  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  In addition to abnormal motion at C2-3.  So due to her progression of clinical syndrome imaging findings of a conservative treatment I recommended posterior cervical fusion from C2-C7.  I have extensively gone over the risks and benefits of the operation with her as well as perioperative course expectations of outcome and alternatives of surgery and she understood and agreed to proceed forward.  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    Past Surgical History:  Procedure Laterality Date   ABDOMINAL HYSTERECTOMY     APPENDECTOMY     BREAST SURGERY Left    lumpectomy   CERVICAL FUSION  x2   CHOLECYSTECTOMY     DIAGNOSTIC LAPAROSCOPY     Hx: of exploratory Lap   DILATION AND CURETTAGE OF UTERUS     LUMBAR FUSION  12/09/2012   LUMBAR LAMINECTOMY/DECOMPRESSION MICRODISCECTOMY     TONSILLECTOMY      Family History  Problem Relation Age of Onset   Allergies Brother    Allergies Mother    Asthma Mother    Asthma Sister    Heart disease Brother    Lung cancer Maternal Uncle        was a smoker   Social History:  reports that she quit smoking about 2 years ago. Her smoking use included cigarettes. She has a 26.50 pack-year smoking history. She has never used smokeless tobacco. She reports that she does not drink alcohol and does not use  drugs.  Allergies:  Allergies  Allergen Reactions   Codeine Nausea And Vomiting   Darvon [Propoxyphene Hcl] Nausea And Vomiting   Fish Allergy Itching and Swelling   Sulfa Antibiotics Nausea And Vomiting    Medications Prior to Admission  Medication Sig Dispense Refill   acetaminophen (TYLENOL) 325 MG tablet Take 650 mg by mouth every 6 (six) hours as needed for moderate pain or mild pain.     levothyroxine (SYNTHROID, LEVOTHROID) 88 MCG tablet Take 88 mcg by mouth daily.     LINZESS 145 MCG CAPS capsule Take 145 mcg by mouth daily.     Vitamin D, Ergocalciferol, (DRISDOL) 1.25 MG (50000 UNIT) CAPS capsule Take 50,000 Units by mouth once a week.      Results for orders placed or performed during the hospital encounter of 11/01/22 (from the past 48 hour(s))  Surgical pcr screen     Status: None   Collection Time: 11/01/22  9:43 AM   Specimen: Nasal Mucosa; Nasal Swab  Result Value Ref Range   MRSA, PCR NEGATIVE NEGATIVE   Staphylococcus aureus NEGATIVE NEGATIVE    Comment: (NOTE) The Xpert SA Assay (FDA approved for NASAL specimens in patients 60 years of age and older), is one component of a comprehensive surveillance program. It is not intended to diagnose infection nor  to guide or monitor treatment. Performed at [facility], [address], [place], [place] [address]   Type and screen [facility]     Status: None   Collection Time: 11/01/22 10:20 AM  Result Value Ref Range   ABO/RH(D) O POS    Antibody Screen NEG    Sample Expiration      11/04/2022,2359 Performed at [facility], [address], [place], [place] [address]    No results found.  Review of Systems  Musculoskeletal:  Positive for neck pain.  Neurological:  Positive for weakness and numbness.    Blood pressure 125/61, pulse 75, temperature 98.1 F (36.7 C), temperature source Oral, resp. rate 18, height 4\' 11"  (1.499 m), weight 54.4 kg, SpO2 100 %. Physical Exam HENT:      Head: Normocephalic.     Right Ear: Tympanic membrane normal.     Nose: Nose normal.     Mouth/Throat:     Mouth: Mucous membranes are moist.  Eyes:     Pupils: Pupils are equal, round, and reactive to light.  Cardiovascular:     Rate and Rhythm: Normal rate.  Pulmonary:     Effort: Pulmonary effort is normal.  Abdominal:     General: Abdomen is flat.  Musculoskeletal:        General: Normal range of motion.  Neurological:     Mental Status: She is alert.     Comments: Strength is 5 of 5 deltoid, bicep, tricep, wrist flexion, wrist extension, hand intrinsics.      Assessment/Plan 72 year old presents for posterior cervical fusion C2-C7.  Mariam Dollar, MD 11/01/2022, 12:50 PM

## 2022-11-01 NOTE — Transfer of Care (Signed)
Immediate Anesthesia Transfer of Care Note  Patient: Donna French  Procedure(s) Performed: Posterior Cervical Fusion with Lateral Mass Fixation, Cervical Two-Cervical Three, Cervical Three-Cervical Four,  Cervical Four-Cervical Five, Cervical Five-Cervical Six,  Cervical Six-Cervical Seven (Spine Cervical)  Patient Location: PACU  Anesthesia Type:General  Level of Consciousness: awake and alert   Airway & Oxygen Therapy: Patient Spontanous Breathing and Patient connected to face mask oxygen  Post-op Assessment: Report given to RN and Post -op Vital signs reviewed and stable  Post vital signs: Reviewed and stable  Last Vitals:  Vitals Value Taken Time  BP 170/61 11/01/22 1656  Temp    Pulse 73 11/01/22 1659  Resp 16 11/01/22 1659  SpO2 100 % 11/01/22 1659  Vitals shown include unvalidated device data.  Last Pain:  Vitals:   11/01/22 1004  TempSrc:   PainSc: 6       Patients Stated Pain Goal: 3 (11/01/22 1004)  Complications:  Encounter Notable Events  Notable Event Outcome Phase Comment  Difficult to intubate - expected  Intraprocedure Filed from anesthesia note documentation.

## 2022-11-02 MED ORDER — HYDROCODONE-ACETAMINOPHEN 5-325 MG PO TABS: 1.0000 | ORAL_TABLET | ORAL | 0 refills | Status: AC | PRN

## 2022-11-02 MED ORDER — METHOCARBAMOL 750 MG PO TABS: 750.0000 mg | ORAL_TABLET | Freq: Four times a day (QID) | ORAL | 0 refills | Status: AC

## 2022-11-02 NOTE — Discharge Summary (Signed)
Physician Discharge Summary  Patient ID: Donna French MRN: [id number] DOB/AGE: Aug 20, 1950 72 y.o.  Admit date: 11/01/2022 Discharge date: 11/02/2022  Admission Diagnoses: Pseudoarthrosis cervical C3 4 C4-5 C5-6 C6-7 with instability C2-3.     Discharge Diagnoses: same   Discharged Condition: good  Hospital Course: The patient was admitted on 11/01/2022 and taken to the operating room where the patient underwent posterior cervical fusion C2-C7. The patient tolerated the procedure well and was taken to the recovery room and then to the floorneck in stable condition. The hospital course was routine. There were no complications. The wound remained clean dry and intact. Pt had appropriate neck soreness. No complaints of arm pain or new N/T/W. The patient remained afebrile with stable vital signs, and tolerated a regular diet. The patient continued to increase activities, and pain was well controlled with oral pain medications.   Consults: None  Significant Diagnostic Studies:  Results for orders placed or performed during the hospital encounter of 11/01/22  Surgical pcr screen   Specimen: Nasal Mucosa; Nasal Swab  Result Value Ref Range   MRSA, PCR NEGATIVE NEGATIVE   Staphylococcus aureus NEGATIVE NEGATIVE  Type and screen [facility]  Result Value Ref Range   ABO/RH(D) O POS    Antibody Screen NEG    Sample Expiration      11/04/2022,2359 Performed at [facility], [address] [place], [place] [address]     DG Cervical Spine 2 or 3 views  Result Date: 11/01/2022 CLINICAL DATA:  Elective surgery. EXAM: CERVICAL SPINE - 2-3 VIEW COMPARISON:  Preoperative radiograph 09/05/2022 FINDINGS: Three fluoroscopic spot views of the cervical spine obtained in the operating room. Prior anterior fusion hardware in place. Interval posterior fusion hardware. Fluoroscopy time 30 seconds. Dose 3.28 mGy. IMPRESSION: Intraoperative fluoroscopic spot views after during cervical  fusion. Electronically Signed   By: Narda Rutherford M.D.   On: 11/01/2022 16:53   DG C-Arm 1-60 Min-No Report  Result Date: 11/01/2022 Fluoroscopy was utilized by the requesting physician.  No radiographic interpretation.    Antibiotics:  Anti-infectives (From admission, onward)    Start     Dose/Rate Route Frequency Ordered Stop   11/01/22 2100  ceFAZolin (ANCEF) IVPB 2g/100 mL premix        2 g 200 mL/hr over 30 Minutes Intravenous Every 8 hours 11/01/22 1842 11/03/22 2059   11/01/22 1845  ceFAZolin (ANCEF) IVPB 2g/100 mL premix  Status:  Discontinued        2 g 200 mL/hr over 30 Minutes Intravenous Every 8 hours 11/01/22 1840 11/01/22 1842   11/01/22 0945  ceFAZolin (ANCEF) IVPB 2g/100 mL premix        2 g 200 mL/hr over 30 Minutes Intravenous On call to O.R. 11/01/22 0942 11/01/22 1320       Discharge Exam: Blood pressure (!) 98/58, pulse 78, temperature 98.3 F (36.8 C), temperature source Oral, resp. rate 16, height 4\' 11"  (1.499 m), weight 54.4 kg, SpO2 100 %. Neurologic: Grossly normal Ambulating and voiding well incision cdi   Discharge Medications:   Allergies as of 11/02/2022       Reactions   Codeine Nausea And Vomiting   Darvon [propoxyphene Hcl] Nausea And Vomiting   Fish Allergy Itching, Swelling   Sulfa Antibiotics Nausea And Vomiting        Medication List     TAKE these medications    acetaminophen 325 MG tablet Commonly known as: TYLENOL Take 650 mg by mouth  every 6 (six) hours as needed for moderate pain or mild pain.   HYDROcodone-acetaminophen 5-325 MG tablet Commonly known as: NORCO/VICODIN Take 1 tablet by mouth every 4 (four) hours as needed for moderate pain.   levothyroxine 88 MCG tablet Commonly known as: SYNTHROID Take 88 mcg by mouth daily.   Linzess 145 MCG Caps capsule Generic drug: linaclotide Take 145 mcg by mouth daily.   methocarbamol 750 MG tablet Commonly known as: Robaxin-750 Take 1 tablet (750 mg total) by mouth 4  (four) times daily.   Vitamin D (Ergocalciferol) 1.25 MG (50000 UNIT) Caps capsule Commonly known as: DRISDOL Take 50,000 Units by mouth once a week.        Disposition: home   Final Dx: posterior cervical fusion C2-C7  Discharge Instructions      Remove dressing in 72 hours   Complete by: As directed    Call MD for:  difficulty breathing, headache or visual disturbances   Complete by: As directed    Call MD for:  hives   Complete by: As directed    Call MD for:  persistant dizziness or light-headedness   Complete by: As directed    Call MD for:  persistant nausea and vomiting   Complete by: As directed    Call MD for:  redness, tenderness, or signs of infection (pain, swelling, redness, odor or green/yellow discharge around incision site)   Complete by: As directed    Call MD for:  severe uncontrolled pain   Complete by: As directed    Call MD for:  temperature >100.4   Complete by: As directed    Diet - low sodium heart healthy   Complete by: As directed    Driving Restrictions   Complete by: As directed    No driving for 2 weeks, no riding in the car for 1 week   Increase activity slowly   Complete by: As directed    Lifting restrictions   Complete by: As directed    No lifting more than 8 lbs          Signed: Tiana Loft Elison Worrel 11/02/2022, 7:41 AM

## 2022-11-02 NOTE — Progress Notes (Signed)
Subjective: Patient reports doing well condition neck pain denies any radicular symptoms  Objective: Vital signs in last 24 hours: Temp:  [97.6 F (36.4 C)-98.6 F (37 C)] 98.3 F (36.8 C) (06/06 0737) Pulse Rate:  [71-78] 78 (06/06 0737) Resp:  [11-21] 16 (06/06 0737) BP: (98-170)/(42-68) 98/58 (06/06 0737) SpO2:  [92 %-100 %] 100 % (06/06 0737) Weight:  [54.4 kg] 54.4 kg (06/05 0951)  Intake/Output from previous day: 06/05 0701 - 06/06 0700 In: 2570 [P.O.:720; I.V.:1500; IV Piggyback:350] Out: 800 [Urine:100; Drains:150; Blood:550] Intake/Output this shift: No intake/output data recorded.  Awake and alert strength is 5 out of 5 incision clean dry and intact  Lab Results: No results for input(s): "WBC", "HGB", "HCT", "PLT" in the last 72 hours. BMET No results for input(s): "NA", "K", "CL", "CO2", "GLUCOSE", "BUN", "CREATININE", "CALCIUM" in the last 72 hours.  Studies/Results: DG Cervical Spine 2 or 3 views  Result Date: 11/01/2022 CLINICAL DATA:  Elective surgery. EXAM: CERVICAL SPINE - 2-3 VIEW COMPARISON:  Preoperative radiograph 09/05/2022 FINDINGS: Three fluoroscopic spot views of the cervical spine obtained in the operating room. Prior anterior fusion hardware in place. Interval posterior fusion hardware. Fluoroscopy time 30 seconds. Dose 3.28 mGy. IMPRESSION: Intraoperative fluoroscopic spot views after during cervical fusion. Electronically Signed   By: Narda Rutherford M.D.   On: 11/01/2022 16:53   DG C-Arm 1-60 Min-No Report  Result Date: 11/01/2022 Fluoroscopy was utilized by the requesting physician.  No radiographic interpretation.    Assessment/Plan: Postop day 1 posterior cervical fusion C2-C7 drain output 150 cc over the last shift we will continue to watch this morning with a drain output decrease if the patient feels up to it and drain output is less than 75 cc we can discharge the patient.  LOS: 1 day     Mariam Dollar 11/02/2022, 7:53 AM

## 2022-11-02 NOTE — Plan of Care (Signed)

## 2022-11-02 NOTE — Evaluation (Signed)
Occupational Therapy Evaluation Patient Details Name: Donna French MRN: [id number] DOB: 10/12/1950 Today's Date: 11/02/2022   History of Present Illness Pt is a 72 year old woman admitted  for C 2-3, C 3-4, C 5-6, C 6-7. PMH: multiple spinal surgeries, asthma, depression, hypothyroidism, arthritis.   Clinical Impression   All education completed and reinforced with written handout. Supportive husband at bedside will provide 24 hour care. Pt and husband verbalized understanding. Pt with mild unsteadiness, she relates to pain meds, declining AD. No further OT needs.     Recommendations for follow up therapy are one component of a multi-disciplinary discharge planning process, led by the attending physician.  Recommendations may be updated based on patient status, additional functional criteria and insurance authorization.   Assistance Recommended at Discharge Frequent or constant Supervision/Assistance  Patient can return home with the following A little help with walking and/or transfers;Assistance with cooking/housework;A little help with bathing/dressing/bathroom;Assist for transportation;Help with stairs or ramp for entrance    Functional Status Assessment  Patient has had a recent decline in their functional status and demonstrates the ability to make significant improvements in function in a reasonable and predictable amount of time.  Equipment Recommendations  None recommended by OT    Recommendations for Other Services       Precautions / Restrictions Precautions Precautions: Cervical;Fall Precaution Booklet Issued: Yes (comment) Required Braces or Orthoses: Cervical Brace Cervical Brace: Soft collar Restrictions Weight Bearing Restrictions: No      Mobility Bed Mobility Overal bed mobility: Modified Independent             General bed mobility comments: demonstrated log roll technique    Transfers Overall transfer level: Needs assistance Equipment used:  None Transfers: Sit to/from Stand Sit to Stand: Supervision                  Balance Overall balance assessment: Needs assistance   Sitting balance-Leahy Scale: Good       Standing balance-Leahy Scale: Fair                             ADL either performed or assessed with clinical judgement   ADL Overall ADL's : Needs assistance/impaired Eating/Feeding: Independent;Bed level   Grooming: Supervision/safety;Standing Grooming Details (indicate cue type and reason): educated in two cup method for oral care and use of face cloth Upper Body Bathing: Supervision/ safety;Standing Upper Body Bathing Details (indicate cue type and reason): educated to keep neck at neutral when washing hair Lower Body Bathing: Supervison/ safety;Sit to/from stand   Upper Body Dressing : Set up;Sitting   Lower Body Dressing: Supervision/safety;Sit to/from stand   Toilet Transfer: Min guard;Ambulation     Toileting - Clothing Manipulation Details (indicate cue type and reason): educated to avoid twisting with pericare   Tub/Shower Transfer Details (indicate cue type and reason): husband will supervise showering Functional mobility during ADLs: Min guard General ADL Comments: Educated pt in IADLs to avoid. Pt has a supportive husband to assist.     Vision Ability to See in Adequate Light: 0 Adequate Patient Visual Report: No change from baseline       Perception     Praxis      Pertinent Vitals/Pain Pain Assessment Pain Assessment: 0-10 Pain Score: 5  Pain Location: neck Pain Descriptors / Indicators: Aching Pain Intervention(s): Monitored during session, Repositioned, Premedicated before session     Hand Dominance Right   Extremity/Trunk Assessment Upper Extremity  Assessment Upper Extremity Assessment: Overall WFL for tasks assessed (arthritic changes in hands)   Lower Extremity Assessment Lower Extremity Assessment: Overall WFL for tasks assessed   Cervical /  Trunk Assessment Cervical / Trunk Assessment: Neck Surgery   Communication Communication Communication: No difficulties   Cognition Arousal/Alertness: Awake/alert Behavior During Therapy: WFL for tasks assessed/performed Overall Cognitive Status: Within Functional Limits for tasks assessed                                       General Comments       Exercises     Shoulder Instructions      Home Living Family/patient expects to be discharged to:: Private residence Living Arrangements: Spouse/significant other Available Help at Discharge: Family;Available 24 hours/day Type of Home: House Home Access: Stairs to enter [employer] of Steps: 3 Entrance Stairs-Rails: Right Home Layout: One level;Laundry or work area in basement (TV room in basement)     Bathroom Shower/Tub: Producer, television/film/video: Handicapped height     Home Equipment: None          Prior Functioning/Environment Prior Level of Function : Independent/Modified Independent;Driving                        OT Problem List:        OT Treatment/Interventions:      OT Goals(Current goals can be found in the care plan section)    OT Frequency:      Co-evaluation              AM-PAC OT "6 Clicks" Daily Activity     Outcome Measure Help from another person eating meals?: None Help from another person taking care of personal grooming?: A Little Help from another person toileting, which includes using toliet, bedpan, or urinal?: A Little Help from another person bathing (including washing, rinsing, drying)?: A Little Help from another person to put on and taking off regular upper body clothing?: None Help from another person to put on and taking off regular lower body clothing?: A Little 6 Click Score: 20   End of Session Equipment Utilized During Treatment: Cervical collar  Activity Tolerance: Patient tolerated treatment well Patient left: in bed;with  call bell/phone within reach;with family/visitor present  OT Visit Diagnosis: Pain;Other abnormalities of gait and mobility (R26.89)                Time: 1610-9604 OT Time Calculation (min): 24 min Charges:  OT General Charges $OT Visit: 1 Visit OT Evaluation $OT Eval Low Complexity: 1 Low OT Treatments $Self Care/Home Management : 8-22 mins  Berna Spare, OTR/L Acute Rehabilitation Services Office: [phone]   Evern Bio 11/02/2022, 9:18 AM

## 2022-11-03 NOTE — Anesthesia Postprocedure Evaluation (Signed)
Anesthesia Post Note  Patient: Donna French  Procedure(s) Performed: Posterior Cervical Fusion with Lateral Mass Fixation, Cervical Two-Cervical Three, Cervical Three-Cervical Four,  Cervical Four-Cervical Five, Cervical Five-Cervical Six,  Cervical Six-Cervical Seven (Spine Cervical)     Patient location during evaluation: PACU Anesthesia Type: General Level of consciousness: awake Pain management: pain level controlled Vital Signs Assessment: post-procedure vital signs reviewed and stable Respiratory status: spontaneous breathing, nonlabored ventilation and respiratory function stable Cardiovascular status: blood pressure returned to baseline and stable Postop Assessment: no apparent nausea or vomiting Anesthetic complications: yes   Encounter Notable Events  Notable Event Outcome Phase Comment  Difficult to intubate - expected  Intraprocedure Filed from anesthesia note documentation.    Last Vitals:  Vitals:   11/02/22 2254 11/03/22 0407  BP: (!) 102/45 (!) 125/54  Pulse: 79 74  Resp: 16 18  Temp: 37.1 C 36.8 C  SpO2: 100% 98%    Last Pain:  Vitals:   11/03/22 0407  TempSrc: Oral  PainSc: 6                  Felicitas Sine P Avraj Lindroth

## 2022-11-03 NOTE — Discharge Summary (Signed)
Physician Discharge Summary  Patient ID: Donna French MRN: [id number] DOB/AGE: 1951/05/05 72 y.o. Estimated body mass index is 24.24 kg/m as calculated from the following:   Height as of this encounter: 4\' 11"  (1.499 m).   Weight as of this encounter: 54.4 kg.   Admit date: 11/01/2022 Discharge date: 11/03/2022  Admission Diagnoses: Cervical pseudoarthrosis  Discharge Diagnoses: Same Principal Problem:   Pseudoarthrosis of cervical spine, subsequent encounter   Discharged Condition: good  Hospital Course: Patient was admitted to hospital underwent posterior cervical fusion did very well postoperatively was experiencing some increased pain as well as drainage was kept an additional day but on postop day 2 was stable for discharge home with scheduled follow-up in 1 to 2 weeks.  Consults: Significant Diagnostic Studies: Treatments: Posterior cervical fusion C2-C7 Discharge Exam: Blood pressure (!) 125/51, pulse 74, temperature 98.2 F (36.8 C), temperature source Oral, resp. rate 20, height 4\' 11"  (1.499 m), weight 54.4 kg, SpO2 100 %. Strength 5 out of 5 wound clean dry and intact  Disposition: Home  Discharge Instructions      Remove dressing in 72 hours   Complete by: As directed    Call MD for:  difficulty breathing, headache or visual disturbances   Complete by: As directed    Call MD for:  hives   Complete by: As directed    Call MD for:  persistant dizziness or light-headedness   Complete by: As directed    Call MD for:  persistant nausea and vomiting   Complete by: As directed    Call MD for:  redness, tenderness, or signs of infection (pain, swelling, redness, odor or green/yellow discharge around incision site)   Complete by: As directed    Call MD for:  severe uncontrolled pain   Complete by: As directed    Call MD for:  temperature >100.4   Complete by: As directed    Diet - low sodium heart healthy   Complete by: As directed    Driving Restrictions    Complete by: As directed    No driving for 2 weeks, no riding in the car for 1 week   Increase activity slowly   Complete by: As directed    Lifting restrictions   Complete by: As directed    No lifting more than 8 lbs      Allergies as of 11/03/2022       Reactions   Codeine Nausea And Vomiting   Darvon [propoxyphene Hcl] Nausea And Vomiting   Fish Allergy Itching, Swelling   Sulfa Antibiotics Nausea And Vomiting        Medication List     TAKE these medications    acetaminophen 325 MG tablet Commonly known as: TYLENOL Take 650 mg by mouth every 6 (six) hours as needed for moderate pain or mild pain.   HYDROcodone-acetaminophen 5-325 MG tablet Commonly known as: NORCO/VICODIN Take 1 tablet by mouth every 4 (four) hours as needed for moderate pain.   levothyroxine 88 MCG tablet Commonly known as: SYNTHROID Take 88 mcg by mouth daily.   Linzess 145 MCG Caps capsule Generic drug: linaclotide Take 145 mcg by mouth daily.   methocarbamol 750 MG tablet Commonly known as: Robaxin-750 Take 1 tablet (750 mg total) by mouth 4 (four) times daily.   Vitamin D (Ergocalciferol) 1.25 MG (50000 UNIT) Caps capsule Commonly known as: DRISDOL Take 50,000 Units by mouth once a week.         Signed: Mariam Dollar  11/03/2022, 8:49 AM

## 2022-11-03 NOTE — Progress Notes (Signed)
Patient alert and oriented, mae's well, voiding adequate amount of urine, swallowing without difficulty, c/o mild pain at time of discharge. Patient discharged home with husband. Script, extra dressing supplies and discharged instructions given to patient. Patient and husband stated understanding of instructions given. Patient has an appointment with Dr. Wynetta Emery.

## 2022-11-03 NOTE — Discharge Instructions (Signed)
Wound Care Keep incision covered and dry until post op day 3. You may remove the Honeycomb dressing on post op day 3. Leave steri-strips on back.  They will fall off by themselves. Do not put any creams, lotions, or ointments on incision. You are fine to shower. Let water run over incision and pat dry.  Activity Activity Walk each and every day, increasing distance each day. No lifting greater than 5 lbs.  No lifting no bending no twisting no driving or riding a car unless coming back and forth to see the doctor.  Diet Resume your normal diet.   Return to Work Will be discussed at your follow up appointment.  Call Your Doctor If Any of These Occur Redness, drainage, or swelling at the wound.  Temperature greater than 101 degrees. Severe pain not relieved by pain medication. Incision starts to come apart.  Follow Up Appt Call [phone] if you have one or any problem.

## 2022-11-06 ENCOUNTER — Encounter (HOSPITAL_COMMUNITY): Payer: Self-pay | Admitting: Neurosurgery

## 2023-12-27 ENCOUNTER — Other Ambulatory Visit (INDEPENDENT_AMBULATORY_CARE_PROVIDER_SITE_OTHER): Payer: Self-pay

## 2023-12-27 ENCOUNTER — Ambulatory Visit: Admitting: Orthopaedic Surgery

## 2023-12-27 DIAGNOSIS — G8929 Other chronic pain: Secondary | ICD-10-CM | POA: Diagnosis not present

## 2023-12-27 DIAGNOSIS — M25511 Pain in right shoulder: Secondary | ICD-10-CM | POA: Diagnosis not present

## 2023-12-27 NOTE — Progress Notes (Signed)
 The patient is a very pleasant and active 73 year old female who was sent to me from Dr. Onetha to evaluate and treat chronic right shoulder issues.  She has been dealing with right shoulder pain and weakness for about 7 months now with no known injury.  It started to wake her up at night at first.  She does have a history of extensive cervical spine surgery and lower spine surgery as well.  She is right-hand dominant.  She says reaching overhead and behind her has become harder in the shoulder has become weak with her ADLs.  She does have a MRI of her right shoulder for us  to review and we did get plain films of the shoulder today.  On examination her right shoulder she has a lot of pain past 90 degrees of abduction.  She appears to be abducting the shoulder correctly in terms of using the rotator cuff as opposed to the deltoids.  Her external rotation is weak and limited and reaching behind her is much harder with the right side than the left side.  Her left shoulder function and motion are entirely normal.  With the right side she is only able to reach to the lower lumbar spine.  There is some grinding in the shoulder as well.  There is pain of the [facility] joint and a positive empty can test as well.  Plain films of the right shoulder reviewed today and the shoulder is well located.  The humeral head is not high riding.  There is significant arthritis of the [facility] joint and it looks like a little bit of inferior arthritis at the glenohumeral joint but there are osteophytes around the humeral head that can be seen on the outlet view.  The MRI is reviewed and there is partial tearing of the rotator cuff.  There is also fluid around the proximal portion of the biceps tendon.  The [facility] joint is significant arthritic and the radiologist mentions there is mild to moderate arthritis of glenohumeral joint but also potentially a loose body.  She does understand her shoulder situation is quite complex and I did show her a  shoulder model and went over the plain films and the MRI findings.  I do feel she is someone that I will defer further treatment of her shoulder to my partner Dr. Glendia Hutchinson given his expertise of his shoulders in terms of whether or not she would benefit from arthroscopic intervention of that shoulder versus a shoulder arthroplasty.  She would like this referral as well.  All questions and concerns were addressed and answered.

## 2024-01-16 ENCOUNTER — Other Ambulatory Visit: Payer: Self-pay

## 2024-01-16 ENCOUNTER — Ambulatory Visit: Admitting: Orthopedic Surgery

## 2024-01-16 ENCOUNTER — Encounter: Payer: Self-pay | Admitting: Orthopedic Surgery

## 2024-01-16 DIAGNOSIS — M25511 Pain in right shoulder: Secondary | ICD-10-CM | POA: Diagnosis not present

## 2024-01-16 DIAGNOSIS — M19011 Primary osteoarthritis, right shoulder: Secondary | ICD-10-CM

## 2024-01-16 DIAGNOSIS — G8929 Other chronic pain: Secondary | ICD-10-CM

## 2024-01-16 NOTE — Progress Notes (Addendum)
 Office Visit Note   Patient: Donna French           Date of Birth: 1950/07/31           MRN: [id number] Visit Date: 01/16/2024 Requested by: Meade Bigness, MD [facility] [address] [place],  [place] [address] PCP: Meade Bigness, MD  Subjective: Chief Complaint  Patient presents with   Right Shoulder - Pain    HPI: Donna French is a 73 y.o. female who presents to the office reporting right shoulder and arm pain.  States that her pain is fairly severe.  Left shoulder and arm and hand no problem.  The pain radiates from her deltoid down to the hand.  She does describe numbness and tingling at times.  Does not report any mechanical symptoms in the shoulder.  MRI scan on disc reviewed shows some synovitis and degenerative changes.  Rotator cuff looks intact..                ROS: All systems reviewed are negative as they relate to the chief complaint within the history of present illness.  Patient denies fevers or chills.  Assessment & Plan: Visit Diagnoses:  1. Chronic right shoulder pain     Plan: Impression is loss of some passive range of motion in that right shoulder.  Ultrasound-guided injection performed in the shoulder today.  Will see if that helps.  There is also appears to be a radicular component to this pain.  We can check her back in about 2 months for clinical recheck and decision for against any more intervention.  Would want her to pay particular attention to how her shoulder feels during the anesthetic portion of the injection today.  Follow-Up Instructions: No follow-ups on file.   Orders:  Orders Placed This Encounter  Procedures   US  Guided Needle Placement - No Linked Charges   No orders of the defined types were placed in this encounter.     Procedures: Large Joint Inj: R glenohumeral on 01/16/2024 10:37 AM Indications: diagnostic evaluation and pain Details: 22 G 3.5 in needle, ultrasound-guided posterior approach  Arthrogram:  No  Medications: 9 mL bupivacaine  0.5 %; 5 mL lidocaine  1 %; 40 mg triamcinolone  acetonide 40 MG/ML Outcome: tolerated well, no immediate complications Procedure, treatment alternatives, risks and benefits explained, specific risks discussed. Consent was given by the patient. Immediately prior to procedure a time out was called to verify the correct patient, procedure, equipment, support staff and site/side marked as required. Patient was prepped and draped in the usual sterile fashion.       Clinical Data: No additional findings.  Objective: Vital Signs: There were no vitals taken for this visit.  Physical Exam:  Constitutional: Patient appears well-developed HEENT:  Head: Normocephalic Eyes:EOM are normal Neck: Normal range of motion Cardiovascular: Normal rate Pulmonary/chest: Effort normal Neurologic: Patient is alert Skin: Skin is warm Psychiatric: Patient has normal mood and affect  Ortho Exam: Ortho exam demonstrates range of motion on the left of 65/110/165.  Range of motion on the right is 50/85/120.  Rotator cuff strength is intact to internal/external rotation.  Not too much grinding or crepitus with internal/external rotation of that right or left-hand side.  No discrete AC joint tenderness.  Neck range of motion particularly limited due to extensive surgery.  No muscle atrophy or wasting in either arm.  No masses lymphadenopathy or skin changes noted in the right shoulder region.  Specialty Comments:  No specialty comments  available.  Imaging: No results found.   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   Chronic cough 08/01/2012   Smoker 08/01/2012   Multiple nodules of lung 08/01/2012   Past Medical History:  Diagnosis Date   Arthritis    Hx: of T/O   Asthma    Hx: of   Complication  of anesthesia    Nausea with hysterectomy, none since with her last 3 surgeries   Depression    Hypothyroidism    Pneumonia    Hx: of   Pseudoarthrosis of cervical spine (HCC) 09/2022    Family History  Problem Relation Age of Onset   Allergies Brother    Allergies Mother    Asthma Mother    Asthma Sister    Heart disease Brother    Lung cancer Maternal Uncle        was a smoker    Past Surgical History:  Procedure Laterality Date   ABDOMINAL HYSTERECTOMY     APPENDECTOMY     BREAST SURGERY Left    lumpectomy   CERVICAL FUSION  x2   CHOLECYSTECTOMY     DIAGNOSTIC LAPAROSCOPY     Hx: of exploratory Lap   DILATION AND CURETTAGE OF UTERUS     LUMBAR FUSION  12/09/2012   LUMBAR LAMINECTOMY/DECOMPRESSION MICRODISCECTOMY     POSTERIOR CERVICAL FUSION/FORAMINOTOMY N/A 11/01/2022   Procedure: Posterior Cervical Fusion with Lateral Mass Fixation, Cervical Two-Cervical Three, Cervical Three-Cervical Four,  Cervical Four-Cervical Five, Cervical Five-Cervical Six,  Cervical Six-Cervical Seven;  Surgeon: Onetha Kuba, MD;  Location: [facility] OR;  Service: Neurosurgery;  Laterality: N/A;   TONSILLECTOMY     Social History   Occupational History   Occupation: Lobbyist: [employer].  Tobacco Use   Smoking status: Former    Current packs/day: 0.00    Average packs/day: 0.5 packs/day for 53.0 years (26.5 ttl pk-yrs)    Types: Cigarettes    Start date: 54    Quit date: 2022    Years since quitting: 3.6   Smokeless tobacco: Never  Vaping Use   Vaping status: Every Day  Substance and Sexual Activity   Alcohol use: No   Drug use: No   Sexual activity: Yes

## 2024-01-17 ENCOUNTER — Encounter: Payer: Self-pay | Admitting: Orthopedic Surgery

## 2024-01-19 MED ORDER — BUPIVACAINE HCL 0.5 % IJ SOLN
9.0000 mL | INTRAMUSCULAR | Status: AC | PRN
Start: 2024-01-16 — End: 2024-01-16
  Administered 2024-01-16: 9 mL via INTRA_ARTICULAR

## 2024-01-19 MED ORDER — TRIAMCINOLONE ACETONIDE 40 MG/ML IJ SUSP
40.0000 mg | INTRAMUSCULAR | Status: AC | PRN
Start: 2024-01-16 — End: 2024-01-16
  Administered 2024-01-16: 40 mg via INTRA_ARTICULAR

## 2024-01-19 MED ORDER — LIDOCAINE HCL 1 % IJ SOLN
5.0000 mL | INTRAMUSCULAR | Status: AC | PRN
Start: 2024-01-16 — End: 2024-01-16
  Administered 2024-01-16: 5 mL

## 2024-02-27 ENCOUNTER — Ambulatory Visit: Admitting: Orthopedic Surgery

## 2024-02-27 DIAGNOSIS — M19011 Primary osteoarthritis, right shoulder: Secondary | ICD-10-CM | POA: Diagnosis not present

## 2024-02-28 ENCOUNTER — Encounter: Payer: Self-pay | Admitting: Orthopedic Surgery

## 2024-02-28 NOTE — Progress Notes (Unsigned)
 Office Visit Note   Patient: Donna French           Date of Birth: 1951-02-10           MRN: [id number] Visit Date: 02/27/2024 Requested by: Meade Bigness, MD [facility] [address] [place],  [place] [address] PCP: Meade Bigness, MD  Subjective: Chief Complaint  Patient presents with   Left Shoulder - Follow-up    HPI: Donna French is a 73 y.o. female who presents to the office reporting left shoulder pain.  Patient had an injection 01/16/2024.  Did not really get any relief from that intra-articular injection.  Describes some popping in the shoulder.  The patient states that the stiffness she had in the shoulder did improve.  She is able to sleep on the right-hand side at this time.  Does report bilateral hand cramping.  Really describing just pain in the shoulder at this time.  Not taking any medication.  Does have a history of major extensive multilevel cervical spine surgery front and back.  Denies any weakness in the shoulder..                ROS: All systems reviewed are negative as they relate to the chief complaint within the history of present illness.  Patient denies fevers or chills.  Assessment & Plan: Visit Diagnoses:  1. Arthritis of right shoulder     Plan: Impression is right shoulder pain unclear etiology.  She is get excellent range of motion today.  Although the pain did not improve the stiffness in the shoulder has improved.  I think if this is coming from her neck there is not really a whole lot to be done about it.  The fact that her pain did not improve from the shoulder indicates that it is likely that her pain generators are not necessarily inside the joint.  MRI scan of the shoulder previously did show synovitis and degenerative changes but the rotator cuff was intact.  Follow-Up Instructions: No follow-ups on file.   Orders:  No orders of the defined types were placed in this encounter.  No orders of the defined types were placed in  this encounter.     Procedures: No procedures performed   Clinical Data: No additional findings.  Objective: Vital Signs: There were no vitals taken for this visit.  Physical Exam:  Constitutional: Patient appears well-developed HEENT:  Head: Normocephalic Eyes:EOM are normal Neck: Normal range of motion Cardiovascular: Normal rate Pulmonary/chest: Effort normal Neurologic: Patient is alert Skin: Skin is warm Psychiatric: Patient has normal mood and affect  Ortho Exam: Ortho exam demonstrates bilateral external rotation 65 degrees passively.  Patient has about 100 degrees of passive abduction and forward flexion to 160 on both sides.  Rotator cuff strength is actually intact bilaterally to infraspinatus supraspinatus and subscap muscle testing.  Does have diminished cervical spine range of motion consistent with her extensive cervical spine surgery.  Not too much crepitus or grinding with passive range of motion of either shoulder.  No discrete AC joint tenderness is noted.  Specialty Comments:  No specialty comments available.  Imaging: No results found.   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   Chronic cough 08/01/2012   Smoker 08/01/2012   Multiple nodules of lung 08/01/2012   Past Medical History:  Diagnosis Date  Arthritis    Hx: of T/O   Asthma    Hx: of   Complication of anesthesia    Nausea with hysterectomy, none since with her last 3 surgeries   Depression    Hypothyroidism    Pneumonia    Hx: of   Pseudoarthrosis of cervical spine (HCC) 09/2022    Family History  Problem Relation Age of Onset   Allergies Brother    Allergies Mother    Asthma Mother    Asthma Sister    Heart disease Brother    Lung cancer Maternal Uncle        was a smoker    Past Surgical History:   Procedure Laterality Date   ABDOMINAL HYSTERECTOMY     APPENDECTOMY     BREAST SURGERY Left    lumpectomy   CERVICAL FUSION  x2   CHOLECYSTECTOMY     DIAGNOSTIC LAPAROSCOPY     Hx: of exploratory Lap   DILATION AND CURETTAGE OF UTERUS     LUMBAR FUSION  12/09/2012   LUMBAR LAMINECTOMY/DECOMPRESSION MICRODISCECTOMY     POSTERIOR CERVICAL FUSION/FORAMINOTOMY N/A 11/01/2022   Procedure: Posterior Cervical Fusion with Lateral Mass Fixation, Cervical Two-Cervical Three, Cervical Three-Cervical Four,  Cervical Four-Cervical Five, Cervical Five-Cervical Six,  Cervical Six-Cervical Seven;  Surgeon: Onetha Kuba, MD;  Location: [facility] OR;  Service: Neurosurgery;  Laterality: N/A;   TONSILLECTOMY     Social History   Occupational History   Occupation: Lobbyist: [employer].  Tobacco Use   Smoking status: Former    Current packs/day: 0.00    Average packs/day: 0.5 packs/day for 53.0 years (26.5 ttl pk-yrs)    Types: Cigarettes    Start date: 77    Quit date: 2022    Years since quitting: 3.7   Smokeless tobacco: Never  Vaping Use   Vaping status: Every Day  Substance and Sexual Activity   Alcohol use: No   Drug use: No   Sexual activity: Yes
